# Patient Record
Sex: Male | Born: 1955
Health system: Southern US, Community
[De-identification: ages and names within clinical notes are randomized; demographics above are authoritative.]

## PROBLEM LIST (undated history)

## (undated) DIAGNOSIS — M069 Rheumatoid arthritis, unspecified: Secondary | ICD-10-CM

---

## 2000-10-09 ENCOUNTER — Encounter (HOSPITAL_COMMUNITY): Admission: RE | Admit: 2000-10-09 | Discharge: 2000-11-08 | Payer: Self-pay | Admitting: Rheumatology

## 2001-01-01 ENCOUNTER — Encounter (HOSPITAL_COMMUNITY): Admission: RE | Admit: 2001-01-01 | Discharge: 2001-01-31 | Payer: Self-pay | Admitting: Rheumatology

## 2001-06-18 ENCOUNTER — Encounter (HOSPITAL_COMMUNITY): Admission: RE | Admit: 2001-06-18 | Discharge: 2001-07-18 | Payer: Self-pay | Admitting: Oncology

## 2001-09-17 ENCOUNTER — Encounter (HOSPITAL_COMMUNITY): Admission: RE | Admit: 2001-09-17 | Discharge: 2001-10-17 | Payer: Self-pay | Admitting: Rheumatology

## 2001-12-31 ENCOUNTER — Encounter (HOSPITAL_COMMUNITY): Admission: RE | Admit: 2001-12-31 | Discharge: 2002-01-30 | Payer: Self-pay | Admitting: Rheumatology

## 2002-03-18 ENCOUNTER — Encounter: Payer: Self-pay | Admitting: Rheumatology

## 2002-03-18 ENCOUNTER — Encounter (HOSPITAL_COMMUNITY): Admission: RE | Admit: 2002-03-18 | Discharge: 2002-03-26 | Payer: Self-pay | Admitting: Rheumatology

## 2002-03-27 ENCOUNTER — Emergency Department (HOSPITAL_COMMUNITY): Admission: EM | Admit: 2002-03-27 | Discharge: 2002-03-27 | Payer: Self-pay | Admitting: Emergency Medicine

## 2002-07-08 ENCOUNTER — Encounter (HOSPITAL_COMMUNITY): Admission: RE | Admit: 2002-07-08 | Discharge: 2002-08-07 | Payer: Self-pay | Admitting: Rheumatology

## 2002-10-28 ENCOUNTER — Encounter (HOSPITAL_COMMUNITY): Admission: RE | Admit: 2002-10-28 | Discharge: 2002-11-27 | Payer: Self-pay | Admitting: Rheumatology

## 2003-02-10 ENCOUNTER — Encounter (HOSPITAL_COMMUNITY): Admission: RE | Admit: 2003-02-10 | Discharge: 2003-03-12 | Payer: Self-pay | Admitting: Rheumatology

## 2003-07-27 ENCOUNTER — Emergency Department (HOSPITAL_COMMUNITY): Admission: EM | Admit: 2003-07-27 | Discharge: 2003-07-28 | Payer: Self-pay | Admitting: *Deleted

## 2003-08-10 ENCOUNTER — Ambulatory Visit (HOSPITAL_COMMUNITY): Admission: RE | Admit: 2003-08-10 | Discharge: 2003-08-10 | Payer: Self-pay | Admitting: Family Medicine

## 2010-05-15 ENCOUNTER — Ambulatory Visit (HOSPITAL_COMMUNITY)
Admission: RE | Admit: 2010-05-15 | Discharge: 2010-05-15 | Payer: Self-pay | Source: Home / Self Care | Attending: Rheumatology | Admitting: Rheumatology

## 2010-09-14 NOTE — Consult Note (Signed)
[facility]  Patient:    Richard Manning, Richard Manning Visit Number: [id number] MRN: [id number]          Service Type: RHE Location: [facility] Attending Physician:  [name] Dictated by:   Nathaneil Canary, M.D.                            Consultation Report  CHIEF COMPLAINT: Rheumatoid arthritis.  HISTORY OF PRESENT ILLNESS:  I had last seen Mr. Stadler on January 01, 2001.  Unfortunately, his appointment had to be canceled because of snow and ice, both in December and January.  He continues to ache in his hands, being the most active joints.  He had a flare of right heel pain about three weeks ago which resolved in a few days.  His ankles and feet still ache moderately. He is stiff in the mornings for about 30 minutes.  He has had no change in his cough or shortness of breath.  There have been no URIs or fever.  Labs checked on January 02, 2000, showed a creatinine of 0.9, hemoglobin 14.9, WBC 9.6, platelets 306, albumin 4.1, AST 20.  CURRENT MEDICATIONS:  Prednisone 5 mg q. day, calcium 600 mg b.i.d., folic acid 1 mg q. day, MVI q. day, methotrexate 20 mg q. day.  PHYSICAL EXAMINATION:  VITAL SIGNS:  Weight 127 pounds, blood pressure 110/70, respirations 16.  GENERAL:  No distress.  LUNGS:  Quiet sounds but no rhonchi or wheeze.  HEART:  Regular.  LOWER EXTREMITIES:  No edema.  MUSCULOSKELETAL: He continues to have moderate synovitis to the PIPs.  The MCPs show mild fullness and have mild tenderness.  Wrists have mild swelling and tenderness.  The elbows, shoulders, and knees have good range of motion. The right knee has a pop anterior to the patella.  The ankles and feet have mild tenderness.  ASSESSMENT AND PLAN: 1. Rheumatoid arthritis.  He continues to show some activity.  I will have him increase the methotrexate to a total of 25 mg per week.  He will take 12.5 mg q. Saturday morning and 12.5 mg q. Sunday morning.  This will all be taken  in a 24-hour window.  He will continue on the folic acid and prednisone as above. We will check labs today. 2. He will return in three months. Dictated by:   Nathaneil Canary, M.D. Attending Physician:  [name] DD:  06/18/01 TD:  06/18/01 Job: [id number]

## 2010-09-14 NOTE — Consult Note (Signed)
Richard Manning, BELMONTES NO.:  [id number]   MEDICAL RECORD NO.:  [id number]                   PATIENT TYPE:   LOCATION:                                       FACILITY:   PHYSICIAN:  [name], M.D.            DATE OF BIRTH:   DATE OF CONSULTATION:  03/18/2002  DATE OF DISCHARGE:                                   CONSULTATION   CHIEF COMPLAINT:  Rheumatoid arthritis.   HISTORY OF PRESENT ILLNESS:  The patient returns reporting that his hands  have been quite stiff and constantly ache.  He is stiff in the mornings for  two to three hours.  He says his energy level is less.  There have been no  URIs or shortness of breath.  He does smoke, and there is some mild  windedness.  His weight is stable.  He complains that he aches in his hips  and points to the lateral hips.  This has bothered him with the weather  changes.   MEDICATIONS:  1. Methotrexate 25 mg each week.  2. Plaquenil 400 mg q.d.  3. Folic acid 1 mg q.d.  4. Calcium 600 mg q.d.  5. Ibuprofen 800 mg q.d.-b.i.d. p.r.n.  6. Tylenol PM h.s.   PHYSICAL EXAMINATION:  VITAL SIGNS:  Weight 135 pounds.  Blood pressure  110/70, respirations 16.  GENERAL:  In no distress.  SKIN:  Clear.  Slightly dark.  LUNGS:  Quiet sounds, but no rhonchi or wheeze.  HEART:  Regular rhythm.  No murmur.  NECK:  Negative JVD.  EXTREMITIES:  Lower extremities, no edema.  MUSCULOSKELETAL:  There continues to be moderate swelling to the PIPs and  MCPs with only mild tenderness.  The wrists have some mild swelling and  tenderness.  Elbows, shoulders good range of motion with mild stiffness.  Knees, ankles, and feet have a good range of motion and are nontender.   ASSESSMENT AND PLAN:  Rheumatoid arthritis.  He has been on Plaquenil now  for six months, and we are making an appointment for him to be evaluated by  Dr. Lita Mains.  He will continue with Plaquenil 400 mg q.d.  He will also  continue to take  methotrexate 25 mg each week.  We will check laboratories  today.  Laboratories on December 11, 2001, showed a WBC 11.9, HGB 15.6, PLT 279.  Creatinine 0.8.  AST 20,  albumin 4.3.  His hands were last x-rayed in January 2001, and we will x-ray  the hands again today.   He will return in four months, and we will also check laboratories in mid  March 2004.                                               Rutherford Guys.  Kellie Simmering, M.D.    WWT/MEDQ  D:  03/18/2002  T:  03/18/2002  Job:  [id number]   cc:   Patrica Duel, M.D.  [address], [address]  [place]  [place] [address]  Fax: [phone]

## 2010-09-14 NOTE — Consult Note (Signed)
NAME:  Richard Manning, Richard Manning NO.:  [id number]   MEDICAL RECORD NO.:  [id number]                   PATIENT TYPE:   LOCATION:                                       FACILITY:   PHYSICIAN:  [name], M.D.            DATE OF BIRTH:   DATE OF CONSULTATION:  02/17/2003  DATE OF DISCHARGE:                                   CONSULTATION   CHIEF COMPLAINT:  Rheumatoid arthritis.   Richard Manning returns reporting his hands have been achy but they are not  bad.  He has cut back on caffeine and feels that this has helped his  headaches.  He had been drinking iced tea throughout the day.  He feels he  is sleeping some better at this time.  He has been able to put on 10 pounds  since seeing him four months ago.  He has some mild shortness of breath but  no excessive cough.  There has been no fever, rashes, stomatitis, nausea, or  vomiting.   MEDICINES:  1. Methotrexate 25 mg weekly.  2. Plaquenil 400 mg daily.  3. Prednisone 5 mg daily.  4. Rare ibuprofen.  5. Calcium 600 mg with vitamin D b.i.d.  6. Multivitamin.  7. Folic acid 1 mg daily.   PHYSICAL EXAMINATION:  VITAL SIGNS:  Weight 138 pounds, blood pressure  118/72, respirations 16.  GENERAL:  He appears well.  LUNGS:  Have quiet sounds but clear.  HEART:  Regular with no murmur.  LOWER EXTREMITIES:  No edema.  NECK:  Negative JVD.  BACK:  Nontender.  MUSCULOSKELETAL:  He has mild chronic synovitis to the PIPs, MCPs, and  wrists.  The second bilateral MCPs are slightly full and all these areas  have slight tenderness.  The wrists are cool and have slight tenderness.  Elbows, shoulders good range of motion.  Knees, ankles, and feet have a good  range of motion and show no active arthritis.   ASSESSMENT AND PLAN:  1. Rheumatoid arthritis.  In general he is stable and he will continue the     methotrexate, folic acid, and Plaquenil as above.  I have written a note     for him to work with Dr.  Geanie Logan office to have labs in mid January to     include the albumin, creatinine, AST, and CBC.  Labs on February 10, 2003     showed a WBC 8.6, HGB 15.3, PLT 271, albumin 3.9, AST 23, creatinine 0.9.  2.     Smoking.  He is continuing to try and cut down and quit.  I have encouraged      him to quit and strategies to quit were again discussed.   He will return to my [place] office in mid February.      ___________________________________________  [name], M.D.   WWT/MEDQ  D:  02/17/2003  T:  02/17/2003  Job:  [id number]   cc:   Patrica Duel, M.D.  [address]  [place]  [place] [address]  Fax: [phone]

## 2010-09-14 NOTE — Consult Note (Signed)
[facility]  Patient:    JOB, HOLTSCLAW Visit Number: [id number] MRN: [id number]          Service Type: RHE Location: [facility] Attending Physician:  [name] Dictated by:   [name], M.D. Proc. Date: 09/17/01 Admit Date:  09/17/2001   CC:         Patrica Duel, M.D.   Consultation Report  CHIEF COMPLAINT:  Rheumatoid arthritis.  BRIEF HISTORY:  The patient called in late winter requesting a refill on the prednisone which I have not refilled because I would prefer that he is off of this medicine.  He is using ibuprofen at this time.  He reports that he is aching more in the hands, and wrist, and feet.  His weight is up by 11 pounds. He has quit smoking recently.  There has been no worsened cough or shortness of breath.  He has no fevers, URIs, nausea, or stomatitis.  He is stiff in the mornings for about 1 hour.  Labs checked on June 18, 2001, showed an albumin 4.4, AST 19, creatinine 0.9, WBC 13.2, hemoglobin 16.3, platelets 272.  MEDICATIONS: 1. Methotrexate 12.5 mg q. Friday and Saturday a.m. 2. Off prednisone. 3. Calcium 600 mg b.i.d. 4. Folic acid 1 mg q. day.  PHYSICAL EXAMINATION:  VITAL SIGNS:  Weight 138 pounds, blood pressure 110/70, respirations 16.  GENERAL:  No distress.  LUNGS:  Quiet sounds.  EXTREMITIES:  Lower extremities no edema.  HEART:  Regular.  MUSCULOSKELETAL:  The hands do show some swelling to the PIPs and MCPs.  There is actually more swelling to the wrist which is tender.  Elbows and shoulders have a good range of motion.  There are no elbow nodules.  Back:  Nontender. Knees:  The left knee flexes well and is nontender.  The right knee has a pop that appears to be close to the patella.  There seems to be no swelling to through his jeans.  Ankles were nonswollen.  The MTPs were tender.  ASSESSMENT AND PLAN:  Rheumatoid arthritis.  As I have explained to him that I would like to keep  him off of the prednisone for the long term.  He will continue with the methotrexate at 25 mg per week.  I have started him on Plaquenil 400 mg q. day.  I have discussed that there is a 1 in 3000 chance of incurring macular degeneration.  We will have his eyes checked about every 6 months to rule out Plaquenil toxicity.  We will check usual labs today.  FOLLOWUP:  He will return in 3 months. Dictated by:   [name], M.D. Attending Physician:  [name] DD:  09/17/01 TD:  09/19/01 Job: [id number]

## 2010-09-14 NOTE — Consult Note (Signed)
Richard Manning, Richard Manning                      ACCOUNT NO.:  [id number]   MEDICAL RECORD NO.:  [id number]                   PATIENT TYPE:   LOCATION:                                       FACILITY:   PHYSICIAN:  [name], M.D.            DATE OF BIRTH:  20-Mar-1956   DATE OF CONSULTATION:  07/08/2002  DATE OF DISCHARGE:                                   CONSULTATION   CHIEF COMPLAINT:  Rheumatoid arthritis.   HISTORY OF PRESENT ILLNESS:  The patient's x-rays of his hands from March 18, 2002 compared to January 2001 did show erosive changes to have  developed, particularly to the third PIP.  This almost has an overhanging  erosive-type change.  There has been a large cyst form at the head of the  second MCP.  There is also an erosive change at the left fourth PIP and  there are cystic changes at the third PIP.  The right triquetrum has some  developed cystic changes.  The patient brought in a prescription profile  from his pharmacist upon my request.  This profile begins on May 31, 2000 when he picked up methotrexate.  Reviewing through this, there had been  several gaps of picking up medicine of about six weeks.  In 2003, there were  two or possibly three times where there was about a two-month gap.   At this time, he says that his fingers, particularly the PIP's, were quite  swollen and tender.  He has had one URI over the winter.  There is no  chronic cough or shortness of breath.  He does smoke.  His weight is stable  but is down 2 pounds.   MEDICATIONS:  1. Methotrexate at approximately 25 mg weekly.  2. Plaquenil 400 mg daily.  3. Ibuprofen 800 mg b.i.d.  4. Folic acid 1 mg daily.  5. Calcium 600 mg daily.   PHYSICAL EXAMINATION:  VITAL SIGNS:  Weight 133 pounds, blood pressure  110/70, respirations 16.  GENERAL:  No distress.  LUNGS:  Quiet sounds but clear.  NECK:  Negative JVD.  HEART:  Regular.  No murmur.  EXTREMITIES:  Lower extremities:  No  edema.  BACK:  Nontender.  MUSCULOSKELETAL:  The hands do show the second and third bilateral PIP's  swollen.  The right second MCP is swollen.  The wrists are nonswollen,  nontender.  Elbows, shoulders good range of motion.  Knees and ankles have  no swelling.  They were nontender.  He had mild MTP tenderness.   ASSESSMENT AND PLAN:  Rheumatoid arthritis.  I still believe that this is  rheumatoid arthritis but the x-rays with the type of erosion that is shown  is somewhat consistent with gout.  I will check a uric acid.  At the present  time, he will continue on the methotrexate and I have encouraged him to be  more consistent taking 25 mg each  week.  He will also take Plaquenil 400 mg  daily.  He has not had his eyes checked in over half a year and we will help  get this set up.  Today, we are giving him an injection of 120 mg of Depo-  Medrol.  I have given him a prescription for prednisone 5 mg.  He will use  this for about three months and the medicine will run out.  I have advised  him to not take ibuprofen each day while taking the prednisone.  This will  reduce the risk of gastrointestinal bleeding.   We will check labs today and he will return in four months.                                               [name], M.D.    WWT/MEDQ  D:  07/08/2002  T:  07/08/2002  Job:  [id number]   cc:   Patrica Duel, M.D.  [address]  [place]  [place] [address]  Fax: [phone]

## 2010-09-14 NOTE — Consult Note (Signed)
Richard Manning, CABREJA NO.:  [id number]   MEDICAL RECORD NO.:  [id number]                   PATIENT TYPE:   LOCATION:                                       FACILITY:   PHYSICIAN:  [name], M.D.            DATE OF BIRTH:   DATE OF CONSULTATION:  12/31/2001  DATE OF DISCHARGE:                                   CONSULTATION   CHIEF COMPLAINT:  Rheumatoid arthritis.   HISTORY OF PRESENT ILLNESS:  The patient returns after using the Plaquenil  for three months and feels that he is somewhat better.  He has had flare  into his hands and wrists.  He has also had achiness and soreness to his  shoulders.  He is still able to lift his shoulders fairly well.  He says  this feels as it did when I started working with him early on.  His weight  is stable, although down about 4 pounds.  He has had no significant URI's,  fever, cough, nausea, stomatitis, or shortness of breath.  He does continue  to smoke.  He is quite stiff in the morning.  He has questions about  swelling to the wrist and whether this can be surgically removed.  This is  probably not a surgical type matter.   MEDICATIONS:  1. Plaquenil 400 mg q.d.  2. Methotrexate 12.5 mg in 24 hours on Saturday.  3. Folic acid 1 mg q.d.  4. Multivitamin q.d.  5. Calcium 600 mg b.i.d.  6. Ibuprofen 800 mg 1 q.d.-b.i.d.  7. Tylenol PM h.s.   PHYSICAL EXAMINATION:  VITAL SIGNS:  Weight 133 pounds.  Blood pressure  114/60, respirations 14.  LUNGS:  Quiet sounds but clear.  HEART:  Regular.  SKIN:  Overall clear, tan.  EXTREMITIES:  Lower extremities:  No edema.  MUSCULOSKELETAL:  The MCP's show little synovitis and they are nontender.  He has more chronic swelling to the wrist, particularly to the left ulnar  area with dorsal synovitis.  These areas are mildly tender.  The elbows  extend fully and there are no nodules.  Shoulders have stiffness but a good  range of motion.  Back nontender.  Hips  and knees good range of motion and  there is no pain.  The ankles had no swelling and were mildly tender.  The  feet had no tenderness.   ASSESSMENT AND PLAN:  Rheumatoid arthritis.  He continues to have the slow  progression of his arthritis, although I find that he is under reasonable  control at this time.  He did discuss that he has significant fatigue with  this, which is a common problem with rheumatoid arthritis.  The plan is to  continue the medicines as above.  Labs  checked on Sep 17, 2001 showed an albumin at 4.0, AST 28, WBC 8.1, HGB 14.7,  PLT 89, and creatinine 0.8.  We will repeat labs  today.   He will return in three months.                                               [name], M.D.    WWT/MEDQ  D:  12/31/2001  T:  01/01/2002  Job:  [id number]   cc:   Jonell Cluck, M.D.  [address], [address]  [place]  [place] [address]  Fax: [phone]

## 2010-09-14 NOTE — Consult Note (Signed)
NAME:  Richard Manning, Credit NO.:  [id number]   MEDICAL RECORD NO.:  [id number]                   PATIENT TYPE:   LOCATION:                                       FACILITY:   PHYSICIAN:  Richard Manning, M.D.            DATE OF BIRTH:   DATE OF CONSULTATION:  10/28/2002  DATE OF DISCHARGE:                                   CONSULTATION   CHIEF COMPLAINT:  Rheumatoid arthritis, headaches.   HISTORY OF PRESENT ILLNESS:  Richard Manning returns reporting that he has  been aching some in his left thumb and he has noticed some left elbow  nodules.  He has other nodules to the right hand on some of the fingers.  He  has some numbness around the left fourth PIP where there is a nodule.  He  has been having headaches more frequently over the last few weeks.  He says  he normally has about four headaches a year, and he has had one to two  headaches each week for a month.  He has lost 5 pounds, and he says he has a  good appetite and is eating three meals a day.  He has some slight cough  with his smoking, but no real shortness of breath.  He denies fevers,  rashes, stomatitis or back pain.   MEDICATIONS:  1. Plaquenil 400 mg daily.  2. Methotrexate 25 mg q.week.  3. Prednisone 5 mg daily.  4. Rare ibuprofen 800 mg.  5. Folic acid 1 mg daily.  6. Calcium 600 mg daily.   PHYSICAL EXAMINATION:  VITAL SIGNS:  Weight 128 pounds, blood pressure  100/70, respirations 16.  GENERAL APPEARANCE:  No distress.  SKIN:  Clear except for slight nodules at the fourth right PIP and on the  tendon surface of the back of the right hand.  There is a nodule at the left  olecranon area that is cool.  MUSCULOSKELETAL:  The hands and wrists have mild fullness to the PIP, MCP  and wrist, but they are cool and nontender.  Elbows extend fully.  Shoulders, knees, ankles, feet have a good range of motion and show no  active arthritis.   ASSESSMENT/PLAN:  1. Rheumatoid arthritis.   He is overall quite stable.  We will continue the     prednisone, methotrexate and Plaquenil as above.  He did have his eyes     checked by Dr. Lita Manning about two months ago, and he is fine to continue     Plaquenil.  Labs checked in March showed a WBC 9.0, HGB 15.4, PLT 283,     albumin 4.2, AST 22, creatinine 0.8.  We will check labs today and again     in three months.  2. Headaches.  He has found that occasional use of ibuprofen 800 mg helps     the headaches.  It is suggested that he can add Tylenol 1000/1500 mg  with     this once or twice several days per week.  He knows to not take a great     deal of ibuprofen while on the prednisone.  If this is not helping over a     few weeks, he will discuss this situation with Dr. Nobie Manning.   FOLLOWUP:  He will return in four months.                                               Richard Manning, M.D.    WWT/MEDQ  D:  10/28/2002  T:  10/28/2002  Job:  [id number]   cc:   Richard Manning, M.D.  [address], [address]  [place]  [place] [address]  Fax: [phone]

## 2011-05-29 DIAGNOSIS — M069 Rheumatoid arthritis, unspecified: Secondary | ICD-10-CM | POA: Diagnosis not present

## 2011-05-29 DIAGNOSIS — Z79899 Other long term (current) drug therapy: Secondary | ICD-10-CM | POA: Diagnosis not present

## 2011-07-18 DIAGNOSIS — Z79899 Other long term (current) drug therapy: Secondary | ICD-10-CM | POA: Diagnosis not present

## 2011-07-18 DIAGNOSIS — M069 Rheumatoid arthritis, unspecified: Secondary | ICD-10-CM | POA: Diagnosis not present

## 2011-07-18 DIAGNOSIS — J069 Acute upper respiratory infection, unspecified: Secondary | ICD-10-CM | POA: Diagnosis not present

## 2011-08-21 DIAGNOSIS — M069 Rheumatoid arthritis, unspecified: Secondary | ICD-10-CM | POA: Diagnosis not present

## 2011-08-21 DIAGNOSIS — J069 Acute upper respiratory infection, unspecified: Secondary | ICD-10-CM | POA: Diagnosis not present

## 2011-08-21 DIAGNOSIS — Z79899 Other long term (current) drug therapy: Secondary | ICD-10-CM | POA: Diagnosis not present

## 2011-11-27 DIAGNOSIS — J069 Acute upper respiratory infection, unspecified: Secondary | ICD-10-CM | POA: Diagnosis not present

## 2011-11-27 DIAGNOSIS — Z79899 Other long term (current) drug therapy: Secondary | ICD-10-CM | POA: Diagnosis not present

## 2011-11-27 DIAGNOSIS — M069 Rheumatoid arthritis, unspecified: Secondary | ICD-10-CM | POA: Diagnosis not present

## 2011-12-16 DIAGNOSIS — R634 Abnormal weight loss: Secondary | ICD-10-CM | POA: Diagnosis not present

## 2011-12-16 DIAGNOSIS — F17200 Nicotine dependence, unspecified, uncomplicated: Secondary | ICD-10-CM | POA: Diagnosis not present

## 2011-12-16 DIAGNOSIS — M069 Rheumatoid arthritis, unspecified: Secondary | ICD-10-CM | POA: Diagnosis not present

## 2011-12-16 DIAGNOSIS — Z79899 Other long term (current) drug therapy: Secondary | ICD-10-CM | POA: Diagnosis not present

## 2011-12-23 ENCOUNTER — Ambulatory Visit (HOSPITAL_COMMUNITY)
Admission: RE | Admit: 2011-12-23 | Discharge: 2011-12-23 | Disposition: A | Discharge status: Home / Self Care | Payer: Medicare Other | Source: Ambulatory Visit | Attending: Rheumatology | Admitting: Rheumatology

## 2011-12-23 ENCOUNTER — Other Ambulatory Visit (HOSPITAL_COMMUNITY): Payer: Self-pay | Admitting: Rheumatology

## 2011-12-23 DIAGNOSIS — J40 Bronchitis, not specified as acute or chronic: Secondary | ICD-10-CM | POA: Diagnosis not present

## 2011-12-23 DIAGNOSIS — R634 Abnormal weight loss: Secondary | ICD-10-CM | POA: Diagnosis not present

## 2011-12-23 DIAGNOSIS — F17200 Nicotine dependence, unspecified, uncomplicated: Secondary | ICD-10-CM | POA: Insufficient documentation

## 2011-12-23 DIAGNOSIS — R0602 Shortness of breath: Secondary | ICD-10-CM | POA: Diagnosis not present

## 2012-03-16 DIAGNOSIS — F17200 Nicotine dependence, unspecified, uncomplicated: Secondary | ICD-10-CM | POA: Diagnosis not present

## 2012-03-16 DIAGNOSIS — R634 Abnormal weight loss: Secondary | ICD-10-CM | POA: Diagnosis not present

## 2012-03-16 DIAGNOSIS — Z79899 Other long term (current) drug therapy: Secondary | ICD-10-CM | POA: Diagnosis not present

## 2012-03-16 DIAGNOSIS — M069 Rheumatoid arthritis, unspecified: Secondary | ICD-10-CM | POA: Diagnosis not present

## 2012-05-12 DIAGNOSIS — Z79899 Other long term (current) drug therapy: Secondary | ICD-10-CM | POA: Diagnosis not present

## 2012-05-12 DIAGNOSIS — F17200 Nicotine dependence, unspecified, uncomplicated: Secondary | ICD-10-CM | POA: Diagnosis not present

## 2012-05-12 DIAGNOSIS — M069 Rheumatoid arthritis, unspecified: Secondary | ICD-10-CM | POA: Diagnosis not present

## 2012-06-22 DIAGNOSIS — M069 Rheumatoid arthritis, unspecified: Secondary | ICD-10-CM | POA: Diagnosis not present

## 2012-06-22 DIAGNOSIS — Z79899 Other long term (current) drug therapy: Secondary | ICD-10-CM | POA: Diagnosis not present

## 2012-09-10 DIAGNOSIS — Z79899 Other long term (current) drug therapy: Secondary | ICD-10-CM | POA: Diagnosis not present

## 2012-09-10 DIAGNOSIS — M069 Rheumatoid arthritis, unspecified: Secondary | ICD-10-CM | POA: Diagnosis not present

## 2012-10-06 DIAGNOSIS — Z79899 Other long term (current) drug therapy: Secondary | ICD-10-CM | POA: Diagnosis not present

## 2012-10-06 DIAGNOSIS — M069 Rheumatoid arthritis, unspecified: Secondary | ICD-10-CM | POA: Diagnosis not present

## 2012-10-06 DIAGNOSIS — F17200 Nicotine dependence, unspecified, uncomplicated: Secondary | ICD-10-CM | POA: Diagnosis not present

## 2012-12-31 DIAGNOSIS — M069 Rheumatoid arthritis, unspecified: Secondary | ICD-10-CM | POA: Diagnosis not present

## 2012-12-31 DIAGNOSIS — Z79899 Other long term (current) drug therapy: Secondary | ICD-10-CM | POA: Diagnosis not present

## 2013-03-02 DIAGNOSIS — Z79899 Other long term (current) drug therapy: Secondary | ICD-10-CM | POA: Diagnosis not present

## 2013-03-02 DIAGNOSIS — M069 Rheumatoid arthritis, unspecified: Secondary | ICD-10-CM | POA: Diagnosis not present

## 2013-03-02 DIAGNOSIS — F17200 Nicotine dependence, unspecified, uncomplicated: Secondary | ICD-10-CM | POA: Diagnosis not present

## 2013-04-06 DIAGNOSIS — M069 Rheumatoid arthritis, unspecified: Secondary | ICD-10-CM | POA: Diagnosis not present

## 2013-04-06 DIAGNOSIS — Z79899 Other long term (current) drug therapy: Secondary | ICD-10-CM | POA: Diagnosis not present

## 2013-08-03 DIAGNOSIS — M069 Rheumatoid arthritis, unspecified: Secondary | ICD-10-CM | POA: Diagnosis not present

## 2013-08-03 DIAGNOSIS — Z79899 Other long term (current) drug therapy: Secondary | ICD-10-CM | POA: Diagnosis not present

## 2013-08-03 DIAGNOSIS — M2550 Pain in unspecified joint: Secondary | ICD-10-CM | POA: Diagnosis not present

## 2013-08-05 DIAGNOSIS — M069 Rheumatoid arthritis, unspecified: Secondary | ICD-10-CM | POA: Diagnosis not present

## 2013-08-05 DIAGNOSIS — Z79899 Other long term (current) drug therapy: Secondary | ICD-10-CM | POA: Diagnosis not present

## 2013-08-17 DIAGNOSIS — M069 Rheumatoid arthritis, unspecified: Secondary | ICD-10-CM | POA: Diagnosis not present

## 2013-10-02 ENCOUNTER — Encounter (HOSPITAL_COMMUNITY): Payer: Self-pay | Admitting: Emergency Medicine

## 2013-10-02 ENCOUNTER — Emergency Department (HOSPITAL_COMMUNITY)
Admission: EM | Admit: 2013-10-02 | Discharge: 2013-10-02 | Disposition: A | Discharge status: Home / Self Care | Payer: Medicare Other | Attending: Emergency Medicine | Admitting: Emergency Medicine

## 2013-10-02 DIAGNOSIS — Z8739 Personal history of other diseases of the musculoskeletal system and connective tissue: Secondary | ICD-10-CM | POA: Insufficient documentation

## 2013-10-02 DIAGNOSIS — L509 Urticaria, unspecified: Secondary | ICD-10-CM | POA: Diagnosis not present

## 2013-10-02 DIAGNOSIS — F17200 Nicotine dependence, unspecified, uncomplicated: Secondary | ICD-10-CM | POA: Diagnosis not present

## 2013-10-02 HISTORY — DX: Rheumatoid arthritis, unspecified: M06.9

## 2013-10-02 MED ORDER — LORATADINE 10 MG PO TABS
ORAL_TABLET | ORAL | Status: AC
  Filled 2013-10-02: qty 1

## 2013-10-02 MED ORDER — LORATADINE 10 MG PO TABS
10.0000 mg | ORAL_TABLET | Freq: Every day | ORAL | Status: DC
Start: 2013-10-02 — End: 2013-10-02
  Administered 2013-10-02: 10 mg via ORAL

## 2013-10-02 MED ORDER — FAMOTIDINE 20 MG PO TABS
20.0000 mg | ORAL_TABLET | Freq: Once | ORAL | Status: AC
  Administered 2013-10-02: 20 mg via ORAL
  Filled 2013-10-02: qty 1

## 2013-10-02 NOTE — ED Notes (Addendum)
Pt believes he is having an allergic reaction to parmesan cheese.  Pt reports rash on hips and back.  Pt denies SOB or swelling.  Reports having parmesan cheese yesterday and woke up this morning with rash.  Reports rash is itchy and painful.

## 2013-10-02 NOTE — ED Notes (Addendum)
Pt thinks the Parmesan cheese from last night caused hives, woke up with hives to bottom and bilateral hips and waist line that itch

## 2013-10-02 NOTE — Discharge Instructions (Signed)
Take the Zyrtec once a day, and Pepcid 20 mg twice a day for 5-7 days, to treat the rash.    Hives Hives are itchy, red, swollen areas of the skin. They can vary in size and location on your body. Hives can come and go for hours or several days (acute hives) or for several weeks (chronic hives). Hives do not spread from person to person (noncontagious). They may get worse with scratching, exercise, and emotional stress. CAUSES   Allergic reaction to food, additives, or drugs.  Infections, including the common cold.  Illness, such as vasculitis, lupus, or thyroid disease.  Exposure to sunlight, heat, or cold.  Exercise.  Stress.  Contact with chemicals. SYMPTOMS   Red or white swollen patches on the skin. The patches may change size, shape, and location quickly and repeatedly.  Itching.  Swelling of the hands, feet, and face. This may occur if hives develop deeper in the skin. DIAGNOSIS  Your caregiver can usually tell what is wrong by performing a physical exam. Skin or blood tests may also be done to determine the cause of your hives. In some cases, the cause cannot be determined. TREATMENT  Mild cases usually get better with medicines such as antihistamines. Severe cases may require an emergency epinephrine injection. If the cause of your hives is known, treatment includes avoiding that trigger.  HOME CARE INSTRUCTIONS   Avoid causes that trigger your hives.  Take antihistamines as directed by your caregiver to reduce the severity of your hives. Non-sedating or low-sedating antihistamines are usually recommended. Do not drive while taking an antihistamine.  Take any other medicines prescribed for itching as directed by your caregiver.  Wear loose-fitting clothing.  Keep all follow-up appointments as directed by your caregiver. SEEK MEDICAL CARE IF:   You have persistent or severe itching that is not relieved with medicine.  You have painful or swollen joints. SEEK  IMMEDIATE MEDICAL CARE IF:   You have a fever.  Your tongue or lips are swollen.  You have trouble breathing or swallowing.  You feel tightness in the throat or chest.  You have abdominal pain. These problems may be the first sign of a life-threatening allergic reaction. Call your local emergency services (911 in [place] MAKE SURE YOU:   Understand these instructions.  Will watch your condition.  Will get help right away if you are not doing well or get worse. Document Released: 04/15/2005 Document Revised: 10/15/2011 Document Reviewed: 07/09/2011 [facility] Patient Information 2014 [place], [place].

## 2013-10-02 NOTE — ED Provider Notes (Signed)
CSN: [id number]     Arrival date & time 10/02/13  1850 History   First MD Initiated Contact with Patient 10/02/13 1942 This chart was scribed for Flint Melter, MD by Valera Castle, ED Scribe. This patient was seen in room APA03/APA03 and the patient's care was started at 7:45 PM.     Chief Complaint  Patient presents with  . Rash   (Consider location/radiation/quality/duration/timing/severity/associated sxs/prior Treatment) The history is provided by the patient. No language interpreter was used.   HPI Comments: Richard Manning is a 58 y.o. male who presents to the Emergency Department complaining of an itching generalized, hive-like rash over his buttocks, upper legs, and lower abdomen, onset this morning. He thinks he may be allergic to Parmesean cheese, stating that is the only thing unusual he has eaten recently. He states it has been a while since he has eaten that type of cheese. He denies h/o rash then. He denies fever, lip swelling, throat swelling, trouble swallowing, trouble breathing, and any other associated symptoms.   PCP - Cassell Smiles., MD  Past Medical History  Diagnosis Date  . RA (rheumatoid arthritis)    History reviewed. No pertinent past surgical history. History reviewed. No pertinent family history. History  Substance Use Topics  . Smoking status: Current Every Day Smoker -- 0.50 packs/day  . Smokeless tobacco: Not on file  . Alcohol Use: No    Review of Systems  Constitutional: Negative for fever.  HENT: Negative for facial swelling and trouble swallowing.   Respiratory: Negative for shortness of breath.   Skin: Positive for rash (buttocks, abdomen).  All other systems reviewed and are negative.  Allergies  Benadryl and Peanuts  Home Medications   Prior to Admission medications   Not on File   BP 124/84  Pulse 64  Temp(Src) 98.2 F (36.8 C) (Oral)  Resp 20  Ht 5\' 8"  (1.727 m)  Wt 135 lb (61.236 kg)  BMI 20.53 kg/m2  SpO2  99% Physical Exam  Nursing note and vitals reviewed. Constitutional: He is oriented to person, place, and time. He appears well-developed and well-nourished.  HENT:  Head: Normocephalic and atraumatic.  Right Ear: External ear normal.  Left Ear: External ear normal.  Eyes: Conjunctivae and EOM are normal. Pupils are equal, round, and reactive to light.  Neck: Normal range of motion and phonation normal. Neck supple.  Cardiovascular: Normal rate, regular rhythm, normal heart sounds and intact distal pulses.   Pulmonary/Chest: Effort normal and breath sounds normal. He exhibits no bony tenderness.  Abdominal: Soft. There is no tenderness.  Musculoskeletal: Normal range of motion.  Neurological: He is alert and oriented to person, place, and time. No cranial nerve deficit or sensory deficit. He exhibits normal muscle tone. Coordination normal.  Skin: Skin is warm, dry and intact. Rash noted.  Urticaria around the lower abdomen, bilateral buttocks, and suprapubic area.  Psychiatric: He has a normal mood and affect. His behavior is normal. Judgment and thought content normal.    ED Course  Procedures (including critical care time)  DIAGNOSTIC STUDIES: Oxygen Saturation is 99% on room air, normal by my interpretation.    COORDINATION OF CARE: 7:48 PM-Discussed treatment plan with pt at bedside and pt agreed to plan.   Medications  famotidine (PEPCID) tablet 20 mg (20 mg Oral Given 10/02/13 1954)   Patient Vitals for the past 24 hrs:  BP Temp Temp src Pulse Resp SpO2 Height Weight  10/02/13 1858 124/84 mmHg 98.2 F (36.8 C)  Oral 64 20 99 % 5\' 8"  (1.727 m) 135 lb (61.236 kg)     EKG Interpretation None      MDM   Final diagnoses:  Urticaria    Urticaria, cause unclear. No evidence for serious internal allergic reaction, including angioedema.   Nursing Notes Reviewed/ Care Coordinated Applicable Imaging Reviewed Interpretation of Laboratory Data incorporated into ED  treatment  The patient appears reasonably screened and/or stabilized for discharge and I doubt any other medical condition or other [facility] requiring further screening, evaluation, or treatment in the ED at this time prior to discharge.  Plan: Home Medications- Zyrtec and Pepcid; Home Treatments- rest; return here if the recommended treatment, does not improve the symptoms; Recommended follow up- PCP prn  I personally performed the services described in this documentation, which was scribed in my presence. The recorded information has been reviewed and is accurate.      [facility], MD 10/02/13 [phone]

## 2013-10-02 NOTE — ED Notes (Signed)
MD at bedside. 

## 2013-11-25 DIAGNOSIS — M069 Rheumatoid arthritis, unspecified: Secondary | ICD-10-CM | POA: Diagnosis not present

## 2013-11-25 DIAGNOSIS — Z79899 Other long term (current) drug therapy: Secondary | ICD-10-CM | POA: Diagnosis not present

## 2013-12-30 DIAGNOSIS — Z79899 Other long term (current) drug therapy: Secondary | ICD-10-CM | POA: Diagnosis not present

## 2013-12-30 DIAGNOSIS — M069 Rheumatoid arthritis, unspecified: Secondary | ICD-10-CM | POA: Diagnosis not present

## 2013-12-30 DIAGNOSIS — M25519 Pain in unspecified shoulder: Secondary | ICD-10-CM | POA: Diagnosis not present

## 2014-01-28 IMAGING — CR DG CHEST 2V
2 series · 2 of 2 positions shown · non-contrast
Comparison: None

CLINICAL DATA: Weight loss, smoker, shortness of breath

CHEST - 2 VIEW

[view not recorded (1 of 2)]
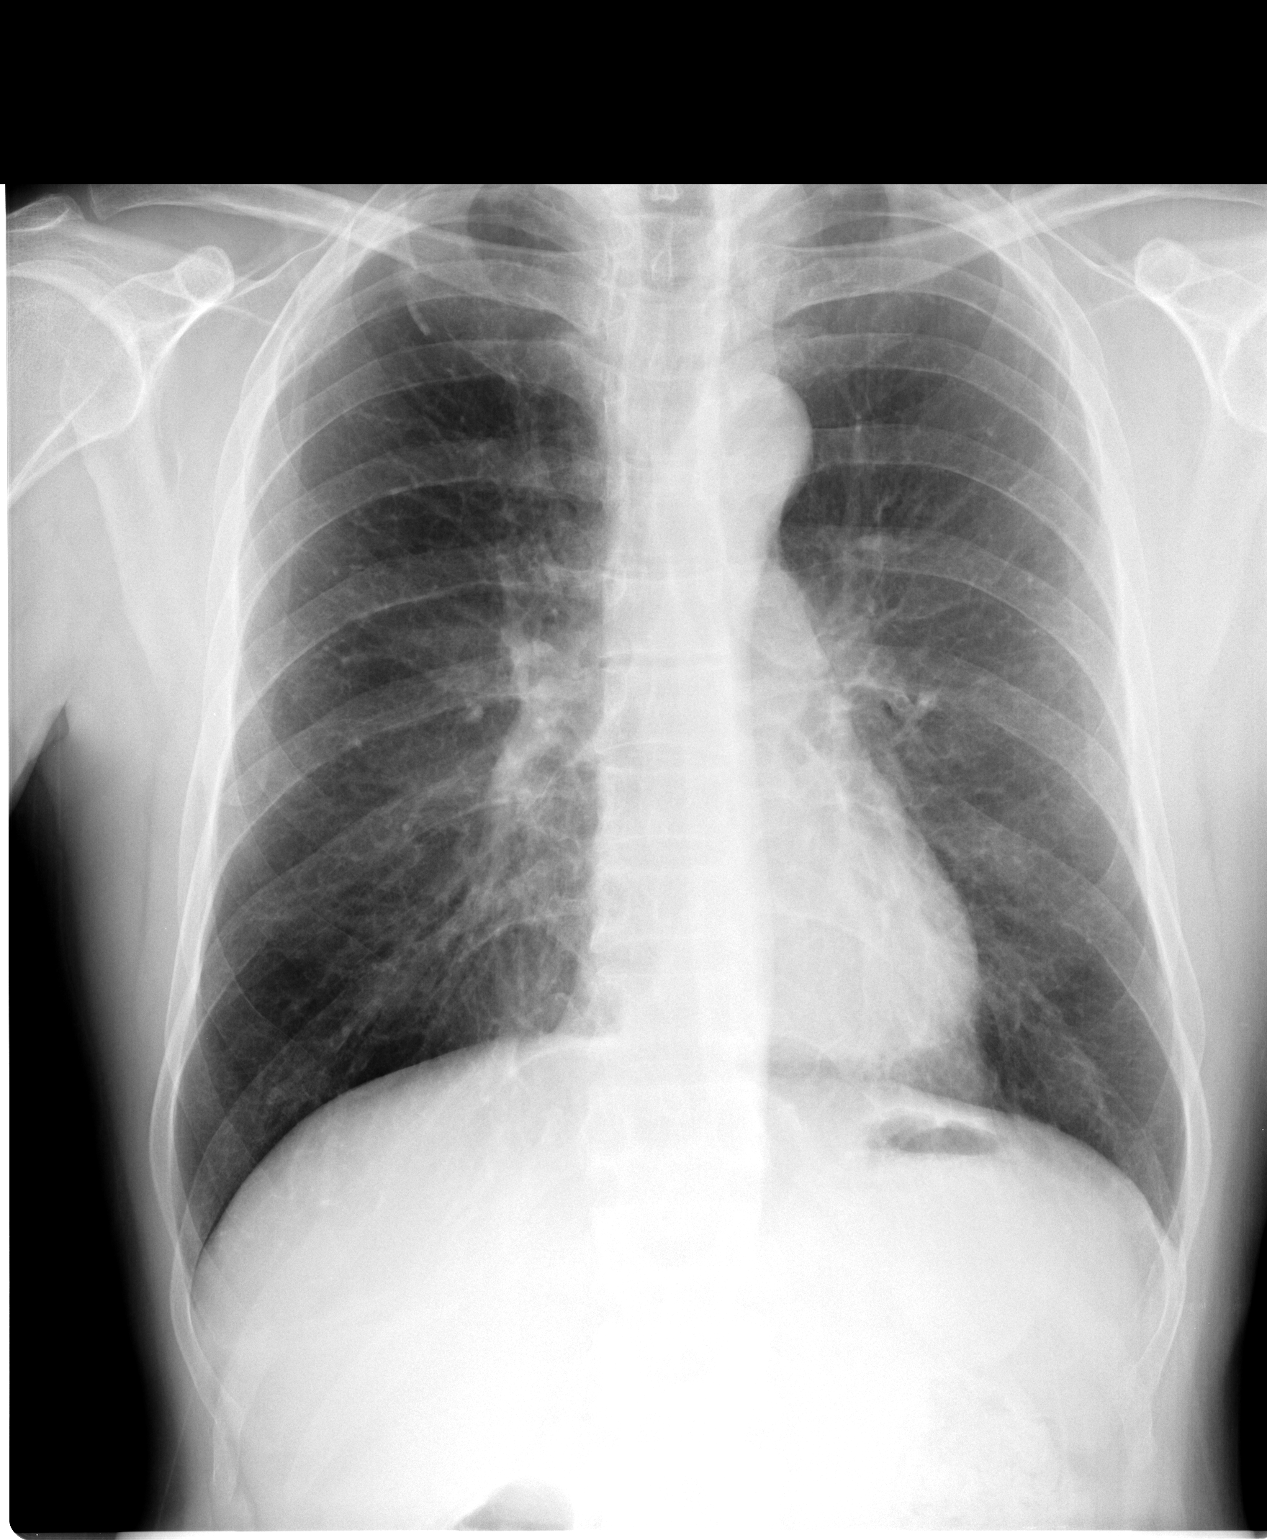

[view not recorded (2 of 2)]
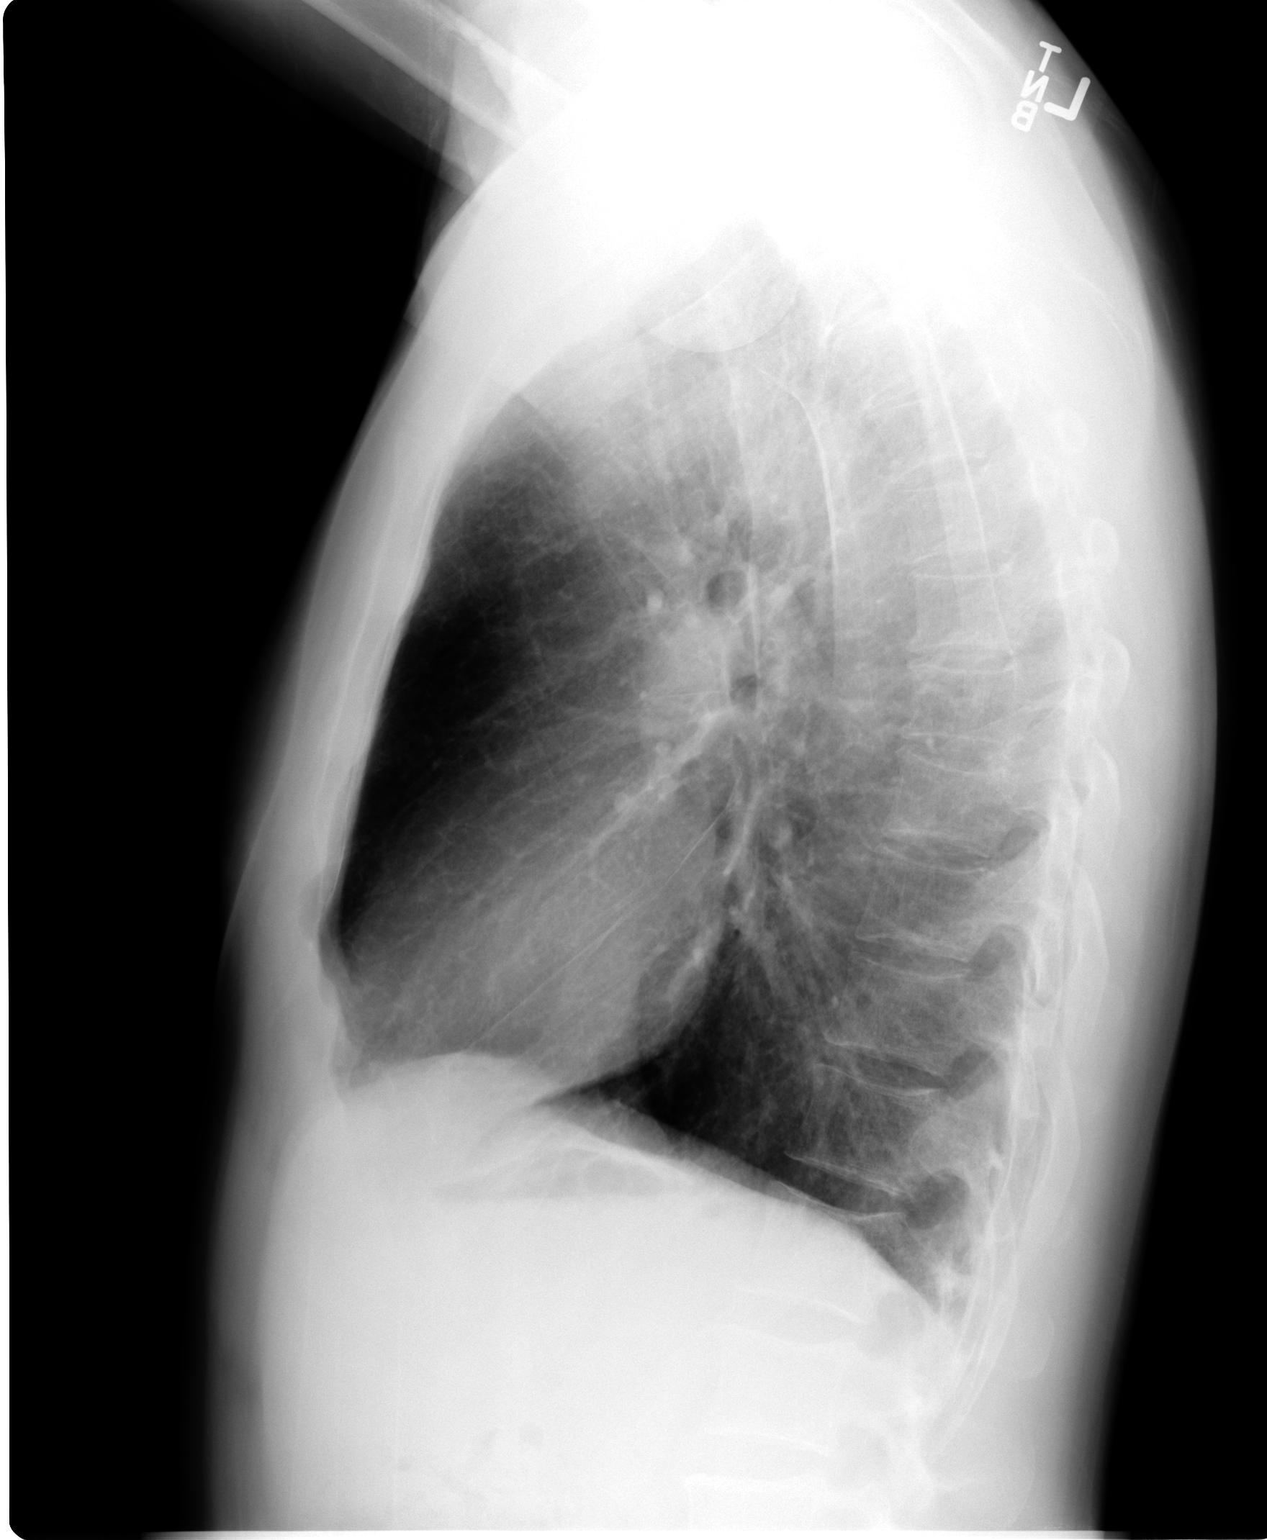

[2 of 2 positions shown; findings below may reference images not displayed]

FINDINGS: Normal heart size, mediastinal contours, and pulmonary vascularity.
Lungs clear.
No pleural effusion or pneumothorax.
Minimal peribronchial thickening.
Bones unremarkable.
IMPRESSION: Minimal bronchitic changes.

## 2014-02-28 DIAGNOSIS — M069 Rheumatoid arthritis, unspecified: Secondary | ICD-10-CM | POA: Diagnosis not present

## 2014-02-28 DIAGNOSIS — Z79899 Other long term (current) drug therapy: Secondary | ICD-10-CM | POA: Diagnosis not present

## 2014-03-29 DIAGNOSIS — J069 Acute upper respiratory infection, unspecified: Secondary | ICD-10-CM | POA: Diagnosis not present

## 2014-03-29 DIAGNOSIS — M069 Rheumatoid arthritis, unspecified: Secondary | ICD-10-CM | POA: Diagnosis not present

## 2014-03-29 DIAGNOSIS — Z7952 Long term (current) use of systemic steroids: Secondary | ICD-10-CM | POA: Diagnosis not present

## 2014-03-29 DIAGNOSIS — M545 Low back pain: Secondary | ICD-10-CM | POA: Diagnosis not present

## 2014-06-01 DIAGNOSIS — M069 Rheumatoid arthritis, unspecified: Secondary | ICD-10-CM | POA: Diagnosis not present

## 2014-06-01 DIAGNOSIS — Z7952 Long term (current) use of systemic steroids: Secondary | ICD-10-CM | POA: Diagnosis not present

## 2014-08-01 DIAGNOSIS — Z79899 Other long term (current) drug therapy: Secondary | ICD-10-CM | POA: Diagnosis not present

## 2014-08-01 DIAGNOSIS — M25441 Effusion, right hand: Secondary | ICD-10-CM | POA: Diagnosis not present

## 2014-08-01 DIAGNOSIS — M25442 Effusion, left hand: Secondary | ICD-10-CM | POA: Diagnosis not present

## 2014-08-01 DIAGNOSIS — M069 Rheumatoid arthritis, unspecified: Secondary | ICD-10-CM | POA: Diagnosis not present

## 2014-09-16 DIAGNOSIS — Z79899 Other long term (current) drug therapy: Secondary | ICD-10-CM | POA: Diagnosis not present

## 2014-09-16 DIAGNOSIS — M059 Rheumatoid arthritis with rheumatoid factor, unspecified: Secondary | ICD-10-CM | POA: Diagnosis not present

## 2014-12-06 DIAGNOSIS — R634 Abnormal weight loss: Secondary | ICD-10-CM | POA: Diagnosis not present

## 2014-12-06 DIAGNOSIS — M0570 Rheumatoid arthritis with rheumatoid factor of unspecified site without organ or systems involvement: Secondary | ICD-10-CM | POA: Diagnosis not present

## 2014-12-06 DIAGNOSIS — R0602 Shortness of breath: Secondary | ICD-10-CM | POA: Diagnosis not present

## 2014-12-06 DIAGNOSIS — Z79899 Other long term (current) drug therapy: Secondary | ICD-10-CM | POA: Diagnosis not present

## 2014-12-07 ENCOUNTER — Ambulatory Visit (HOSPITAL_COMMUNITY)
Admission: RE | Admit: 2014-12-07 | Discharge: 2014-12-07 | Disposition: A | Discharge status: Home / Self Care | Payer: Medicare Other | Source: Ambulatory Visit | Attending: Rheumatology | Admitting: Rheumatology

## 2014-12-07 ENCOUNTER — Other Ambulatory Visit (HOSPITAL_COMMUNITY): Payer: Self-pay | Admitting: Rheumatology

## 2014-12-07 DIAGNOSIS — R634 Abnormal weight loss: Secondary | ICD-10-CM | POA: Insufficient documentation

## 2014-12-07 DIAGNOSIS — F17200 Nicotine dependence, unspecified, uncomplicated: Secondary | ICD-10-CM

## 2014-12-07 DIAGNOSIS — Z720 Tobacco use: Secondary | ICD-10-CM | POA: Insufficient documentation

## 2014-12-07 DIAGNOSIS — R062 Wheezing: Secondary | ICD-10-CM

## 2014-12-07 DIAGNOSIS — M0570 Rheumatoid arthritis with rheumatoid factor of unspecified site without organ or systems involvement: Secondary | ICD-10-CM | POA: Diagnosis not present

## 2014-12-07 DIAGNOSIS — R0602 Shortness of breath: Secondary | ICD-10-CM | POA: Diagnosis not present

## 2014-12-07 DIAGNOSIS — Z79899 Other long term (current) drug therapy: Secondary | ICD-10-CM | POA: Diagnosis not present

## 2015-03-14 DIAGNOSIS — M0570 Rheumatoid arthritis with rheumatoid factor of unspecified site without organ or systems involvement: Secondary | ICD-10-CM | POA: Diagnosis not present

## 2015-03-14 DIAGNOSIS — Z79899 Other long term (current) drug therapy: Secondary | ICD-10-CM | POA: Diagnosis not present

## 2015-05-10 DIAGNOSIS — M79641 Pain in right hand: Secondary | ICD-10-CM | POA: Diagnosis not present

## 2015-05-10 DIAGNOSIS — M79642 Pain in left hand: Secondary | ICD-10-CM | POA: Diagnosis not present

## 2015-05-10 DIAGNOSIS — M0570 Rheumatoid arthritis with rheumatoid factor of unspecified site without organ or systems involvement: Secondary | ICD-10-CM | POA: Diagnosis not present

## 2015-05-10 DIAGNOSIS — Z79899 Other long term (current) drug therapy: Secondary | ICD-10-CM | POA: Diagnosis not present

## 2015-05-10 MED FILL — predniSONE 10 MG TABS: 10 | 30 days supply | Qty: 60 | Fill #0

## 2015-05-10 MED FILL — METHOTREXATE 2.5 MG TABLET: 2.5 | 30 days supply | Qty: 40 | Fill #0

## 2015-06-08 MED FILL — FOLIC ACID 1 MG TABLET: 1 | 30 days supply | Qty: 30 | Fill #6

## 2015-06-08 MED FILL — METHOTREXATE 2.5 MG TABLET: 2.5 | 30 days supply | Qty: 40 | Fill #1

## 2015-06-29 DIAGNOSIS — M0570 Rheumatoid arthritis with rheumatoid factor of unspecified site without organ or systems involvement: Secondary | ICD-10-CM | POA: Diagnosis not present

## 2015-06-29 DIAGNOSIS — Z79899 Other long term (current) drug therapy: Secondary | ICD-10-CM | POA: Diagnosis not present

## 2015-07-06 MED FILL — METHOTREXATE 2.5 MG TABLET: 2.5 | 30 days supply | Qty: 40 | Fill #2

## 2015-07-06 MED FILL — FOLIC ACID 1 MG TABLET: 1 | 30 days supply | Qty: 30 | Fill #7

## 2015-08-03 MED FILL — METHOTREXATE 2.5 MG TABLET: 2.5 | 30 days supply | Qty: 40 | Fill #3

## 2015-08-03 MED FILL — FOLIC ACID 1 MG TABLET: 1 | 30 days supply | Qty: 30 | Fill #8

## 2015-08-30 MED FILL — METHOTREXATE 2.5 MG TABLET: 2.5 | 30 days supply | Qty: 40 | Fill #4

## 2015-08-30 MED FILL — FOLIC ACID 1 MG TABLET: 1 | 30 days supply | Qty: 30 | Fill #9

## 2015-10-02 MED FILL — METHOTREXATE 2.5 MG TABLET: 2.5 | 28 days supply | Qty: 40 | Fill #0

## 2015-10-02 MED FILL — FOLIC ACID 1 MG TABLET: 1 | 90 days supply | Qty: 90 | Fill #0

## 2015-10-10 DIAGNOSIS — M25462 Effusion, left knee: Secondary | ICD-10-CM | POA: Diagnosis not present

## 2015-10-10 DIAGNOSIS — M0570 Rheumatoid arthritis with rheumatoid factor of unspecified site without organ or systems involvement: Secondary | ICD-10-CM | POA: Diagnosis not present

## 2015-10-10 DIAGNOSIS — Z79899 Other long term (current) drug therapy: Secondary | ICD-10-CM | POA: Diagnosis not present

## 2015-10-10 DIAGNOSIS — M25441 Effusion, right hand: Secondary | ICD-10-CM | POA: Diagnosis not present

## 2015-10-10 DIAGNOSIS — M25442 Effusion, left hand: Secondary | ICD-10-CM | POA: Diagnosis not present

## 2015-10-10 DIAGNOSIS — M25461 Effusion, right knee: Secondary | ICD-10-CM | POA: Diagnosis not present

## 2015-10-11 DIAGNOSIS — M25562 Pain in left knee: Secondary | ICD-10-CM | POA: Diagnosis not present

## 2015-10-11 DIAGNOSIS — M25441 Effusion, right hand: Secondary | ICD-10-CM | POA: Diagnosis not present

## 2015-10-11 DIAGNOSIS — M25561 Pain in right knee: Secondary | ICD-10-CM | POA: Diagnosis not present

## 2015-10-11 DIAGNOSIS — M25442 Effusion, left hand: Secondary | ICD-10-CM | POA: Diagnosis not present

## 2015-10-11 DIAGNOSIS — Z79899 Other long term (current) drug therapy: Secondary | ICD-10-CM | POA: Diagnosis not present

## 2015-10-11 DIAGNOSIS — M0570 Rheumatoid arthritis with rheumatoid factor of unspecified site without organ or systems involvement: Secondary | ICD-10-CM | POA: Diagnosis not present

## 2015-11-01 MED FILL — METHOTREXATE 2.5 MG TABLET: 2.5 | 28 days supply | Qty: 40 | Fill #0

## 2015-11-29 MED FILL — METHOTREXATE 2.5 MG TABLET: 2.5 | 28 days supply | Qty: 40 | Fill #1

## 2015-12-28 MED FILL — FOLIC ACID 1 MG TABLET: 1 | 90 days supply | Qty: 90 | Fill #1

## 2015-12-28 MED FILL — METHOTREXATE 2.5 MG TABLET: 2.5 | 28 days supply | Qty: 40 | Fill #2

## 2016-01-09 DIAGNOSIS — M25561 Pain in right knee: Secondary | ICD-10-CM | POA: Diagnosis not present

## 2016-01-09 DIAGNOSIS — M0570 Rheumatoid arthritis with rheumatoid factor of unspecified site without organ or systems involvement: Secondary | ICD-10-CM | POA: Diagnosis not present

## 2016-01-09 DIAGNOSIS — M25442 Effusion, left hand: Secondary | ICD-10-CM | POA: Diagnosis not present

## 2016-01-09 DIAGNOSIS — Z79899 Other long term (current) drug therapy: Secondary | ICD-10-CM | POA: Diagnosis not present

## 2016-01-09 DIAGNOSIS — M25562 Pain in left knee: Secondary | ICD-10-CM | POA: Diagnosis not present

## 2016-01-09 DIAGNOSIS — M25441 Effusion, right hand: Secondary | ICD-10-CM | POA: Diagnosis not present

## 2016-01-24 MED FILL — METHOTREXATE 2.5 MG TABLET: 2.5 | 28 days supply | Qty: 40 | Fill #3

## 2016-02-19 MED FILL — METHOTREXATE 2.5 MG TABLET: 2.5 | 28 days supply | Qty: 40 | Fill #4

## 2016-03-11 DIAGNOSIS — F17200 Nicotine dependence, unspecified, uncomplicated: Secondary | ICD-10-CM | POA: Diagnosis not present

## 2016-03-11 DIAGNOSIS — M25441 Effusion, right hand: Secondary | ICD-10-CM | POA: Diagnosis not present

## 2016-03-11 DIAGNOSIS — M0570 Rheumatoid arthritis with rheumatoid factor of unspecified site without organ or systems involvement: Secondary | ICD-10-CM | POA: Diagnosis not present

## 2016-03-11 DIAGNOSIS — M25442 Effusion, left hand: Secondary | ICD-10-CM | POA: Diagnosis not present

## 2016-03-20 MED FILL — METHOTREXATE 2.5 MG TABLET: 2.5 | 28 days supply | Qty: 40 | Fill #0

## 2016-03-20 MED FILL — FOLIC ACID 1 MG TABLET: 1 | 90 days supply | Qty: 90 | Fill #2

## 2016-04-08 DIAGNOSIS — M25441 Effusion, right hand: Secondary | ICD-10-CM | POA: Diagnosis not present

## 2016-04-08 DIAGNOSIS — F17200 Nicotine dependence, unspecified, uncomplicated: Secondary | ICD-10-CM | POA: Diagnosis not present

## 2016-04-08 DIAGNOSIS — M0570 Rheumatoid arthritis with rheumatoid factor of unspecified site without organ or systems involvement: Secondary | ICD-10-CM | POA: Diagnosis not present

## 2016-04-08 DIAGNOSIS — M25442 Effusion, left hand: Secondary | ICD-10-CM | POA: Diagnosis not present

## 2016-04-16 MED FILL — METHOTREXATE 2.5 MG TABLET: 2.5 | 28 days supply | Qty: 40 | Fill #1

## 2016-05-14 MED FILL — METHOTREXATE 2.5 MG TABLET: 2.5 | 28 days supply | Qty: 40 | Fill #2

## 2016-06-12 MED FILL — METHOTREXATE 2.5 MG TABLET: 2.5 | 28 days supply | Qty: 40 | Fill #3

## 2016-06-12 MED FILL — FOLIC ACID 1 MG TABLET: 1 | 90 days supply | Qty: 90 | Fill #3

## 2016-07-08 DIAGNOSIS — M0570 Rheumatoid arthritis with rheumatoid factor of unspecified site without organ or systems involvement: Secondary | ICD-10-CM | POA: Diagnosis not present

## 2016-07-08 DIAGNOSIS — M25441 Effusion, right hand: Secondary | ICD-10-CM | POA: Diagnosis not present

## 2016-07-08 DIAGNOSIS — M25442 Effusion, left hand: Secondary | ICD-10-CM | POA: Diagnosis not present

## 2016-07-08 DIAGNOSIS — F17200 Nicotine dependence, unspecified, uncomplicated: Secondary | ICD-10-CM | POA: Diagnosis not present

## 2016-07-10 MED FILL — METHOTREXATE 2.5 MG TABLET: 2.5 | 28 days supply | Qty: 40 | Fill #4

## 2016-08-12 DIAGNOSIS — Z79899 Other long term (current) drug therapy: Secondary | ICD-10-CM | POA: Diagnosis not present

## 2016-08-12 DIAGNOSIS — M79642 Pain in left hand: Secondary | ICD-10-CM | POA: Diagnosis not present

## 2016-08-12 DIAGNOSIS — M25511 Pain in right shoulder: Secondary | ICD-10-CM | POA: Diagnosis not present

## 2016-08-12 DIAGNOSIS — M79641 Pain in right hand: Secondary | ICD-10-CM | POA: Diagnosis not present

## 2016-08-12 DIAGNOSIS — M0570 Rheumatoid arthritis with rheumatoid factor of unspecified site without organ or systems involvement: Secondary | ICD-10-CM | POA: Diagnosis not present

## 2016-08-12 MED FILL — METHOTREXATE 2.5 MG TABLET: 2.5 | 28 days supply | Qty: 40 | Fill #0

## 2016-09-03 MED FILL — FOLIC ACID 1 MG TABLET: 1 | 90 days supply | Qty: 90 | Fill #4

## 2016-09-03 MED FILL — METHOTREXATE 2.5 MG TABLET: 2.5 | 28 days supply | Qty: 40 | Fill #1

## 2016-10-04 MED FILL — METHOTREXATE 2.5 MG TABLET: 2.5 | 28 days supply | Qty: 40 | Fill #2

## 2016-12-17 DIAGNOSIS — M069 Rheumatoid arthritis, unspecified: Secondary | ICD-10-CM | POA: Diagnosis not present

## 2017-01-12 IMAGING — DX DG CHEST 2V
2 series · 2 of 2 positions shown · non-contrast
Comparison: 12/23/2011

CLINICAL DATA: Shortness of breath, wheezing, weight loss

EXAM:
CHEST  2 VIEW

[chest pa]
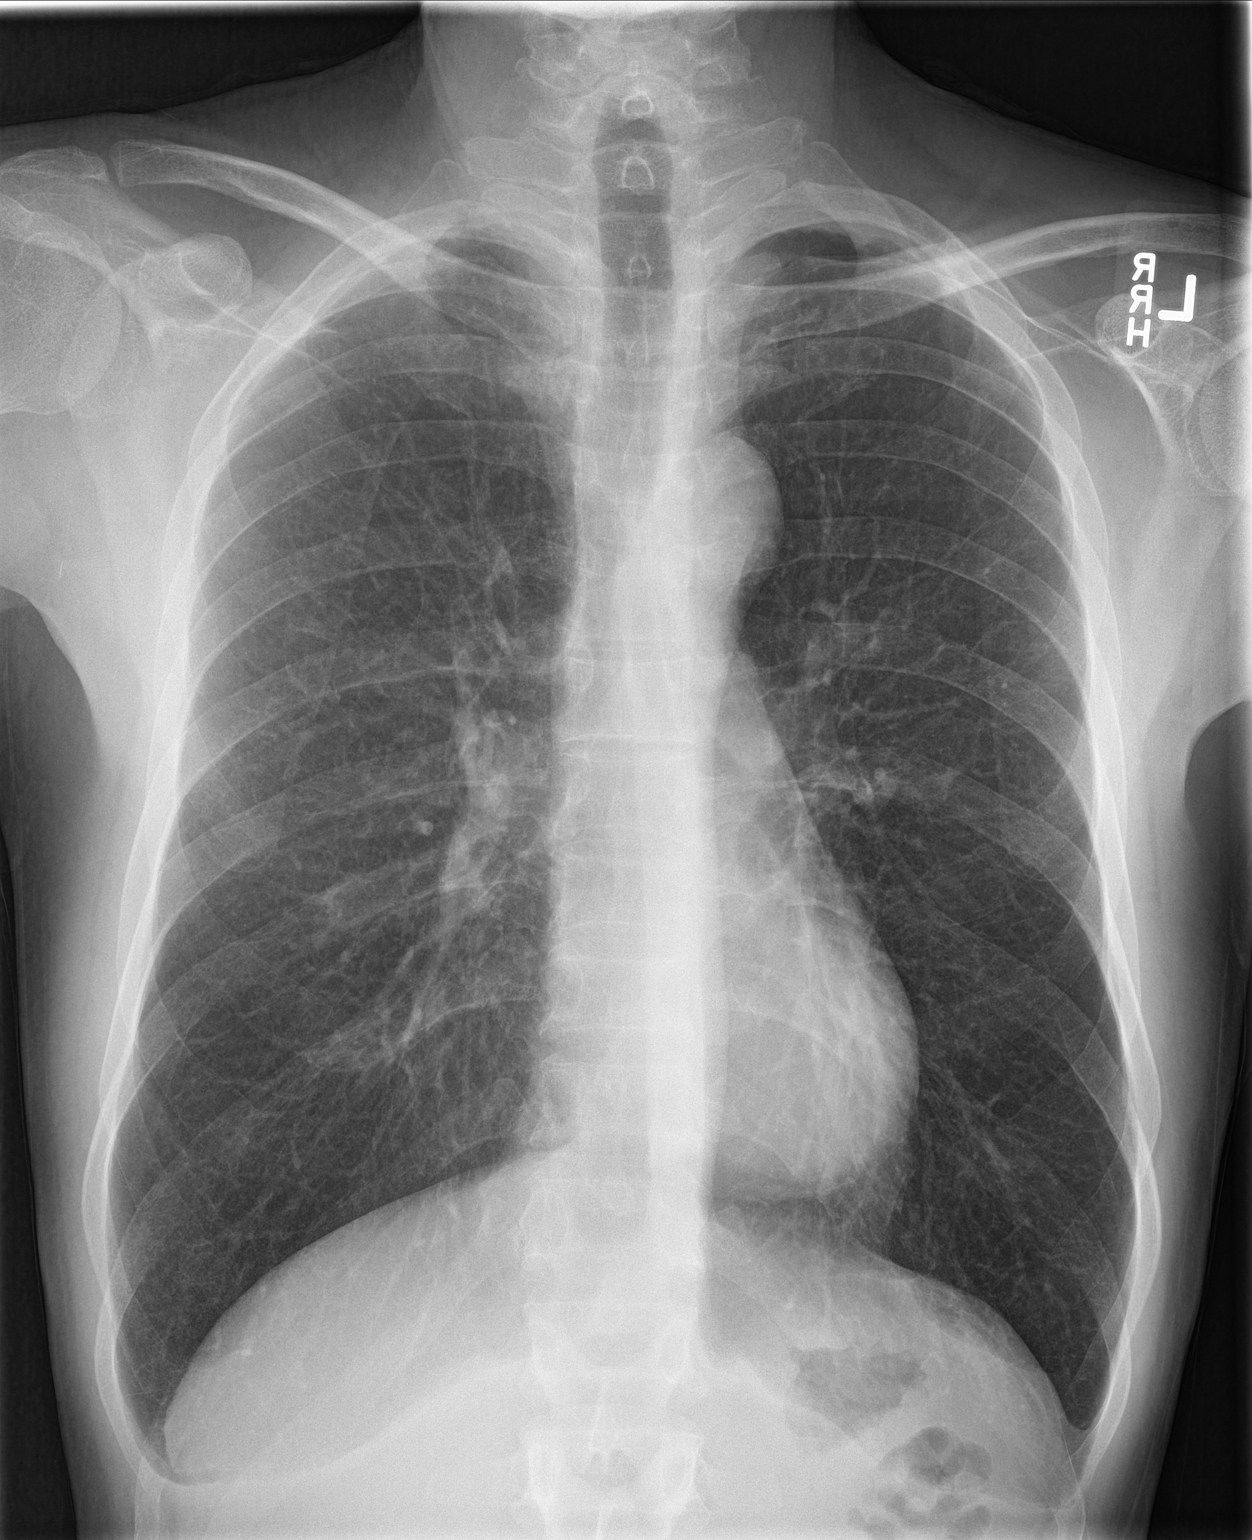

[chest lat]
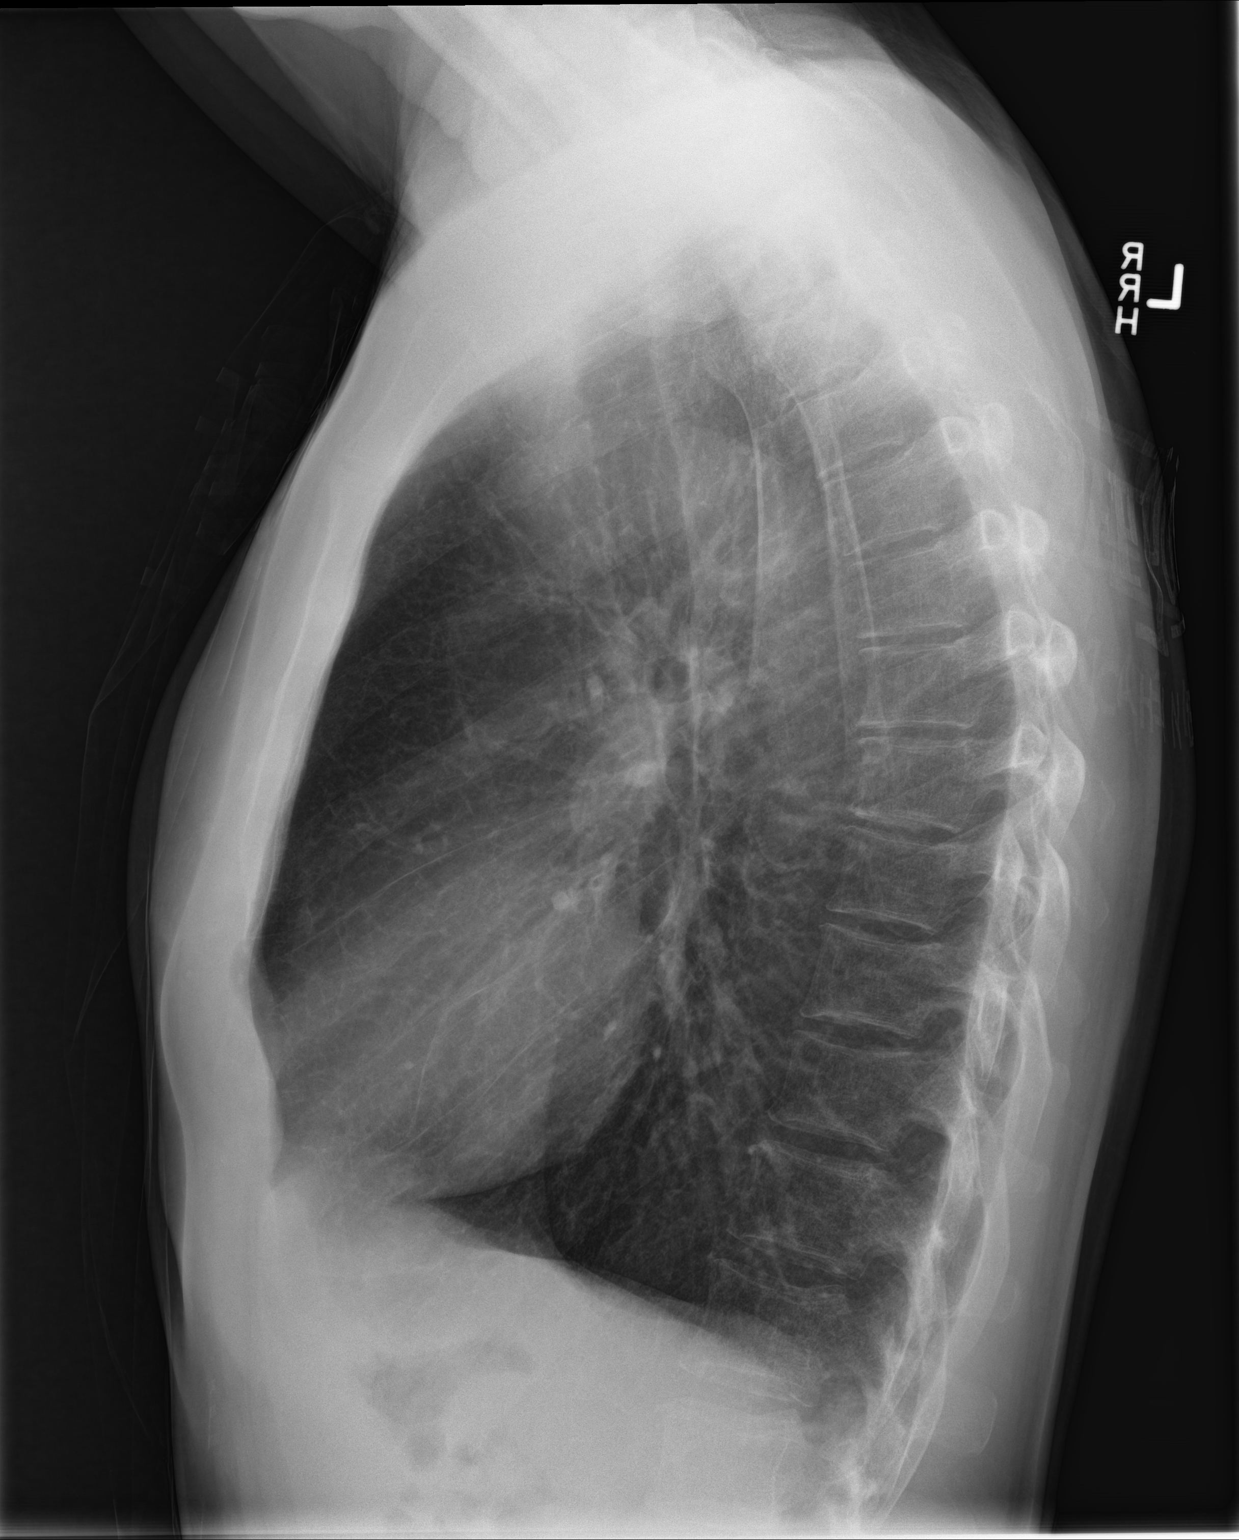

[2 of 2 positions shown; findings below may reference images not displayed]

FINDINGS: Cardiomediastinal silhouette is stable. Hyperinflation is noted.
Stable chronic mild bronchitic changes. No acute infiltrate or
pleural effusion. No pulmonary edema. Bony thorax is stable.
IMPRESSION: No active disease. Hyperinflation again noted. Stable central mild
bronchitic changes.

## 2017-05-23 NOTE — Progress Notes (Signed)
Office Visit Note  Patient: Richard Manning             Date of Birth: [birth year]           MRN: [id number]             PCP: Elfredia Nevins, MD Referring: Laverle Hobby, MD Visit Date: 05/28/2017 Occupation: Disability    Subjective:  Pain in hands   History of Present Illness: Richard Manning is a 62 y.o. male  seen in consultation per request of Dr. Wende Crease.according to patient the symptoms is started in 1998 with pain in his hands. The following year he got worse and could not even get out of his bed. He states he was seen at the [facility] where Dr. Kellie Simmering evaluated him and did some blood work. He diagnosed him with rheumatoid arthritis and started him on methotrexate, folic acid and prednisone.He was tried on combination therapy with sulfasalazine and Plaquenil but he could not tolerate both medications. He states she's been on methotrexate for multiple years with prednisone taper when necessary. After Dr. Kellie Simmering retired he got some methotrexate for 3 months from urgent care. He's been out of methotrexate for the last 3 months and has been taking prednisone from 10-20 mg a day. He reports pain and discomfort in his bilateral hands and his feet. He has occasional discomfort in his right shoulder.    Activities of Daily Living:  Patient reports morning stiffness for 2 hour.   Patient Reports nocturnal pain.  Difficulty dressing/grooming: Denies Difficulty climbing stairs: Denies Difficulty getting out of chair: Reports Difficulty using hands for taps, buttons, cutlery, and/or writing: Reports   Review of Systems  Constitutional: Positive for fatigue. Negative for night sweats and weakness.  HENT: Negative for mouth sores, mouth dryness and nose dryness.   Eyes: Negative for redness, visual disturbance and dryness.  Respiratory: Negative for cough, hemoptysis, shortness of breath and difficulty breathing.   Cardiovascular: Negative for chest pain, palpitations,  hypertension, irregular heartbeat and swelling in legs/feet.  Gastrointestinal: Negative for blood in stool, constipation and diarrhea.  Endocrine: Negative for increased urination.  Genitourinary: Negative for painful urination.  Musculoskeletal: Positive for arthralgias, joint pain, joint swelling, morning stiffness and muscle tenderness. Negative for myalgias, muscle weakness and myalgias.  Skin: Positive for nodules/bumps. Negative for color change, pallor, rash, hair loss, redness, skin tightness, ulcers and sensitivity to sunlight.  Allergic/Immunologic: Negative for susceptible to infections.  Neurological: Negative for dizziness, fainting, memory loss and night sweats.  Hematological: Negative for swollen glands.  Psychiatric/Behavioral: Negative for depressed mood and sleep disturbance. The patient is not nervous/anxious.     PMFS History:  There are no active problems to display for this patient.   Past Medical History:  Diagnosis Date  . RA (rheumatoid arthritis) (HCC)     Family History  Problem Relation Age of Onset  . Atrial fibrillation Mother   . Cancer Mother        breast    History reviewed. No pertinent surgical history. Social History   Social History Narrative  . Not on file     Objective: Vital Signs: BP 120/68 (BP Location: Left Arm, Patient Position: Sitting, Cuff Size: Normal)   Pulse 77   Resp 15   Ht 5\' 9"  (1.753 m)   Wt 130 lb (59 kg)   BMI 19.20 kg/m    Physical Exam  Constitutional: He is oriented to person, place, and time. He appears well-developed and well-nourished.  HENT:  Head: Normocephalic and atraumatic.  Eyes: Conjunctivae and EOM are normal. Pupils are equal, round, and reactive to light.  Neck: Normal range of motion. Neck supple.  Cardiovascular: Normal rate, regular rhythm and normal heart sounds.  Pulmonary/Chest: Effort normal and breath sounds normal.  Abdominal: Soft. Bowel sounds are normal.  Neurological: He is  alert and oriented to person, place, and time.  Skin: Skin is warm and dry. Capillary refill takes less than 2 seconds.  Psychiatric: He has a normal mood and affect. His behavior is normal.  Nursing note and vitals reviewed.    Musculoskeletal Exam: C-spine and thoracic lumbar spine good range of motion. He had discomfort range of motion of his right shoulder. Elbow joints wrist joint MCPs PIPs DIPs showed synovitis as described below. He has some discomfort range of motion of his right hip. Right knee joint had limited extension and some warmth on palpation. He also had some MTP joint tenderness and synovitis as described below.  CDAI Exam: CDAI Homunculus Exam:   Tenderness:  RUE: glenohumeral Right hand: 1st MCP, 2nd MCP, 5th MCP, 2nd PIP, 3rd PIP, 4th PIP and 5th PIP Left hand: 1st MCP, 2nd MCP, 3rd MCP and 5th MCP RLE: tibiofemoral Right foot: 1st MTP and 3rd MTP Left foot: 4th MTP and 5th MTP  Swelling:  Right hand: 1st MCP, 2nd MCP, 5th MCP, 2nd PIP and 4th PIP Left hand: 1st MCP, 2nd MCP, 3rd MCP, 5th MCP, 2nd PIP, 4th PIP and 5th PIP RLE: tibiofemoral Right foot: 1st MTP and 3rd MTP Left foot: 4th MTP and 5th MTP  Joint Counts:  CDAI Tender Joint count: 13 CDAI Swollen Joint count: 13  Global Assessments:  Patient Global Assessment: 6 Provider Global Assessment: 6  CDAI Calculated Score: 38    Investigation: No additional findings.   Imaging: Xr Foot 2 Views Left  Result Date: 05/28/2017 All MTP joint narrowing was noted. Generalized osteopenia was noted. He appears to have callus formation in the second and third metatarsal shafts. Severe erosive changes were noted in the fifth MTP joint. No significant intertarsal joint space narrowing was noted. Tibiotalar joint space narrowing was noted. Inferior calcaneal spur was noted. Impression: These findings are consistent with erosive rheumatoid arthritis.  Xr Foot 2 Views Right  Result Date: 05/28/2017 All MTP  joint severe narrowing was noted. Generalized osteopenia was noted. Severe erosive changes were noted in the third fourth and fifth MTP joints. No significant intertarsal joint space narrowing was noted. Some tibiotalar joint space narrowing was noted. Impression: These findings are consistent with rheumatoid arthritis of the foot.  Xr Hand 2 View Left  Result Date: 05/28/2017 Juxta articular osteopenia was noted. First second and third MCP joint narrowing was noted. PIP and DIP joint narrowing was noted. Erosive change was noted in the fifth MCP , erosive versus cystic changes were noted in the second and third MCP joint. Erosions in the carpal joint was also noted. Impression: These findings are consistent with erosive rheumatoid arthritis.  Xr Hand 2 View Right  Result Date: 05/28/2017 All PIP and DIP joint space narrowing was noted. Severe narrowing of the second MCP joint with invagination was noted. Juxta articular osteopenia was noted. Narrowing of all MCP joints was noted. Erosive changes were noted and second MCP jointand second PIP joint. Intercarpal and radiocarpal joint space narrowing was noted. Cystic versus erosive changes were noted in the radius. Impression: These findings are consistent with erosive rheumatoid arthritis.  Xr Knee 3  View Right  Result Date: 05/28/2017 Mild medial compartment narrowing was noted. No chondrocalcinosis was noted. Moderate patellofemoral narrowing was noted. Impression: These findings consistent with mild osteoarthritis of the knee joint and moderate chondromalacia patella  Xr Shoulder Right  Result Date: 05/28/2017 No glenohumeral joint space narrowing was noted. No chondrocalcinosis was noted. No significant acromioclavicular joint space narrowing was noted. Impression: Unremarkable x-ray of the shoulder joint.   Speciality Comments: No specialty comments available.    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  Rheumatoid arthritis with rheumatoid factor of multiple sites without organ or systems involvement (HCC) -severe erosive rheumatoid arthritis with nodulosis. Dx by Dr. Kellie Simmering for many years with methotrexate and prednisone.-he has active synovitis in multiple joints today. He has been treated with methotrexate and prednisone combination by Dr. Kellie Simmering for multiple years. Detailed counseling regarding rheumatoid arthritis was provided.Different treatment options and their side effects were discussed. He wants to stay on methotrexate for right now. Handout was given consent was taken. Once the labs are available we will call in his prescription for methotrexate. He's been taking methotrexate 5 tablets by mouth twice a week. Based on his exam he will need more aggressive therapy in future. As he's been on long-term prednisone I will keep him on prednisone 10 mg by mouth daily. Hopefully eventually we should be able to taper him off prednisone. Plan: Sedimentation rate, Rheumatoid factor, Cyclic citrul peptide antibody, IgG, ANA  High risk medication use - MTX: 5 tablets po on Thursdays and 5 tablets po on Fridayspreviously on: PLQ and SSZ - Plan: CBC with Differential/Platelet, COMPLETE METABOLIC PANEL WITH GFR, Urinalysis, Routine w reflex microscopic, Hepatitis B core antibody, IgM, Hepatitis B surface antigen, Hepatitis C antibody, HIV antibody, QuantiFERON-TB Gold Plus, Serum protein electrophoresis with reflex, IgG, IgA, IgM  On prednisone therapy - Prednisone 10 mg daily - Plan: VITAMIN D 25 Hydroxy (Vit-D Deficiency, Fractures) He will need a DEXA scan in future.  Chronic right shoulder pain - Plan: XR Shoulder Right  Pain in both hands - Plan: XR Hand 2 View Right, XR Hand 2 View Left  Chronic pain of right knee - Plan: XR KNEE 3 VIEW RIGHT  Pain in both feet  - Plan: XR Foot 2 Views Right, XR Foot 2 Views Left  Smoker : Smoking cessation was discussed at length.   Orders: Orders Placed This Encounter  Procedures  . XR Hand 2 View Right  . XR Hand 2 View Left  . XR KNEE 3 VIEW RIGHT  . XR Foot 2 Views Right  . XR Foot 2 Views Left  . XR Shoulder Right  . CBC with Differential/Platelet  . COMPLETE METABOLIC PANEL WITH GFR  . Urinalysis, Routine w reflex microscopic  . Sedimentation rate  . Rheumatoid factor  . Cyclic citrul peptide antibody, IgG  . ANA  . Hepatitis B core antibody, IgM  . Hepatitis B surface antigen  . Hepatitis C antibody  . HIV antibody  . QuantiFERON-TB Gold Plus  . Serum protein electrophoresis with reflex  . IgG, IgA, IgM  . VITAMIN D 25 Hydroxy (Vit-D Deficiency, Fractures)   Meds ordered this encounter  Medications  . predniSONE (DELTASONE) 10 MG tablet    Sig: Take 1 tablet (10 mg total) by mouth daily with breakfast.    Dispense:  30 tablet  Refill:  2    Face-to-face time spent with patient was  . Greater than50% of time was spent in counseling and coordination of care.  Follow-Up Instructions: Return for Rheumatoid arthritis.   Pollyann Savoy, MD  Note - This record has been created using Animal nutritionist.  Chart creation errors have been sought, but may not always  have been located. Such creation errors do not reflect on  the standard of medical care.

## 2017-05-28 ENCOUNTER — Ambulatory Visit (INDEPENDENT_AMBULATORY_CARE_PROVIDER_SITE_OTHER): Payer: Medicare Other | Admitting: Rheumatology

## 2017-05-28 ENCOUNTER — Ambulatory Visit (INDEPENDENT_AMBULATORY_CARE_PROVIDER_SITE_OTHER): Payer: Self-pay

## 2017-05-28 ENCOUNTER — Encounter: Payer: Self-pay | Admitting: Rheumatology

## 2017-05-28 VITALS — BP 120/68 | HR 77 | Resp 15 | Ht 69.0 in | Wt 130.0 lb

## 2017-05-28 DIAGNOSIS — Z79899 Other long term (current) drug therapy: Secondary | ICD-10-CM | POA: Diagnosis not present

## 2017-05-28 DIAGNOSIS — M0630 Rheumatoid nodule, unspecified site: Secondary | ICD-10-CM | POA: Diagnosis not present

## 2017-05-28 DIAGNOSIS — M0579 Rheumatoid arthritis with rheumatoid factor of multiple sites without organ or systems involvement: Secondary | ICD-10-CM

## 2017-05-28 DIAGNOSIS — F17200 Nicotine dependence, unspecified, uncomplicated: Secondary | ICD-10-CM

## 2017-05-28 DIAGNOSIS — G8929 Other chronic pain: Secondary | ICD-10-CM | POA: Diagnosis not present

## 2017-05-28 DIAGNOSIS — M79642 Pain in left hand: Secondary | ICD-10-CM | POA: Diagnosis not present

## 2017-05-28 DIAGNOSIS — M79671 Pain in right foot: Secondary | ICD-10-CM | POA: Diagnosis not present

## 2017-05-28 DIAGNOSIS — M79641 Pain in right hand: Secondary | ICD-10-CM

## 2017-05-28 DIAGNOSIS — M25511 Pain in right shoulder: Secondary | ICD-10-CM | POA: Diagnosis not present

## 2017-05-28 DIAGNOSIS — M79672 Pain in left foot: Secondary | ICD-10-CM

## 2017-05-28 DIAGNOSIS — M25561 Pain in right knee: Secondary | ICD-10-CM

## 2017-05-28 DIAGNOSIS — Z7952 Long term (current) use of systemic steroids: Secondary | ICD-10-CM

## 2017-05-28 MED ORDER — PREDNISONE 10 MG PO TABS: 10.0000 mg | ORAL_TABLET | Freq: Every day | ORAL | 2 refills | Status: DC

## 2017-05-28 MED FILL — predniSONE 10 MG TABS: 10 | 30 days supply | Qty: 30 | Fill #0

## 2017-05-28 NOTE — Patient Instructions (Addendum)
Methotrexate tablets What is this medicine? METHOTREXATE (METH oh TREX ate) is a chemotherapy drug used to treat cancer including breast cancer, leukemia, and lymphoma. This medicine can also be used to treat psoriasis and certain kinds of arthritis. This medicine may be used for other purposes; ask your health care provider or pharmacist if you have questions. COMMON BRAND NAME(S): Rheumatrex, Trexall What should I tell my health care provider before I take this medicine? They need to know if you have any of these conditions: -fluid in the stomach area or lungs -if you often drink alcohol -infection or immune system problems -kidney disease or on hemodialysis -liver disease -low blood counts, like low white cell, platelet, or red cell counts -lung disease -radiation therapy -stomach ulcers -ulcerative colitis -an unusual or allergic reaction to methotrexate, other medicines, foods, dyes, or preservatives -pregnant or trying to get pregnant -breast-feeding How should I use this medicine? Take this medicine by mouth with a glass of water. Follow the directions on the prescription label. Take your medicine at regular intervals. Do not take it more often than directed. Do not stop taking except on your doctor's advice. Make sure you know why you are taking this medicine and how often you should take it. If this medicine is used for a condition that is not cancer, like arthritis or psoriasis, it should be taken weekly, NOT daily. Taking this medicine more often than directed can cause serious side effects, even death. Talk to your healthcare provider about safe handling and disposal of this medicine. You may need to take special precautions. Talk to your pediatrician regarding the use of this medicine in children. While this drug may be prescribed for selected conditions, precautions do apply. Overdosage: If you think you have taken too much of this medicine contact a poison control center or  emergency room at once. NOTE: This medicine is only for you. Do not share this medicine with others. What if I miss a dose? If you miss a dose, talk with your doctor or health care professional. Do not take double or extra doses. What may interact with this medicine? This medicine may interact with the following medication: -acitretin -aspirin and aspirin-like medicines including salicylates -azathioprine -certain antibiotics like penicillins, tetracycline, and chloramphenicol -cyclosporine -gold -hydroxychloroquine -live virus vaccines -NSAIDs, medicines for pain and inflammation, like ibuprofen or naproxen -other cytotoxic agents -penicillamine -phenylbutazone -phenytoin -probenecid -retinoids such as isotretinoin and tretinoin -steroid medicines like prednisone or cortisone -sulfonamides like sulfasalazine and trimethoprim/sulfamethoxazole -theophylline This list may not describe all possible interactions. Give your health care provider a list of all the medicines, herbs, non-prescription drugs, or dietary supplements you use. Also tell them if you smoke, drink alcohol, or use illegal drugs. Some items may interact with your medicine. What should I watch for while using this medicine? Avoid alcoholic drinks. This medicine can make you more sensitive to the sun. Keep out of the sun. If you cannot avoid being in the sun, wear protective clothing and use sunscreen. Do not use sun lamps or tanning beds/booths. You may need blood work done while you are taking this medicine. Call your doctor or health care professional for advice if you get a fever, chills or sore throat, or other symptoms of a cold or flu. Do not treat yourself. This drug decreases your body's ability to fight infections. Try to avoid being around people who are sick. This medicine may increase your risk to bruise or bleed. Call your doctor or health care professional   if you notice any unusual bleeding. Check with your  doctor or health care professional if you get an attack of severe diarrhea, nausea and vomiting, or if you sweat a lot. The loss of too much body fluid can make it dangerous for you to take this medicine. Talk to your doctor about your risk of cancer. You may be more at risk for certain types of cancers if you take this medicine. Both men and women must use effective birth control with this medicine. Do not become pregnant while taking this medicine or until at least 1 normal menstrual cycle has occurred after stopping it. Women should inform their doctor if they wish to become pregnant or think they might be pregnant. Men should not father a child while taking this medicine and for 3 months after stopping it. There is a potential for serious side effects to an unborn child. Talk to your health care professional or pharmacist for more information. Do not breast-feed an infant while taking this medicine. What side effects may I notice from receiving this medicine? Side effects that you should report to your doctor or health care professional as soon as possible: -allergic reactions like skin rash, itching or hives, swelling of the face, lips, or tongue -breathing problems or shortness of breath -diarrhea -dry, nonproductive cough -low blood counts - this medicine may decrease the number of white blood cells, red blood cells and platelets. You may be at increased risk for infections and bleeding. -mouth sores -redness, blistering, peeling or loosening of the skin, including inside the mouth -signs of infection - fever or chills, cough, sore throat, pain or trouble passing urine -signs and symptoms of bleeding such as bloody or black, tarry stools; red or dark-brown urine; spitting up blood or brown material that looks like coffee grounds; red spots on the skin; unusual bruising or bleeding from the eye, gums, or nose -signs and symptoms of kidney injury like trouble passing urine or change in the amount  of urine -signs and symptoms of liver injury like dark yellow or brown urine; general ill feeling or flu-like symptoms; light-colored stools; loss of appetite; nausea; right upper belly pain; unusually weak or tired; yellowing of the eyes or skin Side effects that usually do not require medical attention (report to your doctor or health care professional if they continue or are bothersome): -dizziness -hair loss -tiredness -upset stomach -vomiting This list may not describe all possible side effects. Call your doctor for medical advice about side effects. You may report side effects to FDA at 1-800-FDA-1088. Where should I keep my medicine? Keep out of the reach of children. Store at room temperature between 20 and 25 degrees C (68 and 77 degrees F). Protect from light. Throw away any unused medicine after the expiration date. NOTE: This sheet is a summary. It may not cover all possible information. If you have questions about this medicine, talk to your doctor, pharmacist, or health care provider.  2018 [employer]vier/Gold Standard (2014-12-19 05:39:22) Standing Labs We placed an order today for your standing lab work.    Please come back and get your standing labs in  Weeks after starting methotrexate and then every 3 months  We have open lab Monday through Friday from 8:30-11:30 AM and 1:30-4 PM at the office of Dr. Pollyann Savoy.   The office is located at [address], [place], [place] [address] No appointment is necessary.   Labs are drawn by [facility].  You may receive a bill  from [facility] for your lab work. If you have any questions regarding directions or hours of operation,  please call [phone].

## 2017-05-30 ENCOUNTER — Telehealth: Payer: Self-pay | Admitting: *Deleted

## 2017-05-30 DIAGNOSIS — E559 Vitamin D deficiency, unspecified: Secondary | ICD-10-CM

## 2017-05-30 LAB — HEPATITIS C ANTIBODY
Hepatitis C Ab: NONREACTIVE
SIGNAL TO CUT-OFF: 0.01 (ref <1.00)

## 2017-05-30 LAB — PROTEIN ELECTROPHORESIS, SERUM, WITH REFLEX
ALBUMIN ELP: 4 g/dL (ref 3.8–4.8)
Alpha 1: 0.3 g/dL (ref 0.2–0.3)
Alpha 2: 0.9 g/dL (ref 0.5–0.9)
BETA 2: 0.5 g/dL (ref 0.2–0.5)
BETA GLOBULIN: 0.6 g/dL (ref 0.4–0.6)
Gamma Globulin: 1.4 g/dL (ref 0.8–1.7)
TOTAL PROTEIN: 7.7 g/dL (ref 6.1–8.1)

## 2017-05-30 LAB — COMPLETE METABOLIC PANEL WITH GFR
AG RATIO: 1.2 (calc) (ref 1.0–2.5)
ALT: 12 U/L (ref 9–46)
AST: 16 U/L (ref 10–35)
Albumin: 4.2 g/dL (ref 3.6–5.1)
Alkaline phosphatase (APISO): 83 U/L (ref 40–115)
BUN: 12 mg/dL (ref 7–25)
CALCIUM: 10 mg/dL (ref 8.6–10.3)
CO2: 27 mmol/L (ref 20–32)
CREATININE: 0.89 mg/dL (ref 0.70–1.33)
Chloride: 105 mmol/L (ref 98–110)
GFR, EST AFRICAN AMERICAN: 108 mL/min/{1.73_m2} (ref >=60)
GFR, Est Non African American: 94 mL/min/{1.73_m2} (ref >=60)
GLOBULIN: 3.4 g/dL (ref 1.9–3.7)
Glucose, Bld: 90 mg/dL (ref 65–99)
POTASSIUM: 4.1 mmol/L (ref 3.5–5.3)
SODIUM: 138 mmol/L (ref 135–146)
Total Bilirubin: 0.4 mg/dL (ref 0.2–1.2)
Total Protein: 7.6 g/dL (ref 6.1–8.1)

## 2017-05-30 LAB — CYCLIC CITRUL PEPTIDE ANTIBODY, IGG: Cyclic Citrullin Peptide Ab: >250 UNITS — ABNORMAL HIGH

## 2017-05-30 LAB — CBC WITH DIFFERENTIAL/PLATELET
Basophils Absolute: 44 cells/uL (ref 0–200)
Basophils Relative: 0.5 %
EOS PCT: 1.5 %
Eosinophils Absolute: 132 cells/uL (ref 15–500)
HEMATOCRIT: 49.6 % (ref 38.5–50.0)
HEMOGLOBIN: 17.5 g/dL — AB (ref 13.2–17.1)
LYMPHS ABS: 2402 {cells}/uL (ref 850–3900)
MCH: 33.2 pg — ABNORMAL HIGH (ref 27.0–33.0)
MCHC: 35.3 g/dL (ref 32.0–36.0)
MCV: 94.1 fL (ref 80.0–100.0)
MPV: 9.9 fL (ref 7.5–12.5)
Monocytes Relative: 7.6 %
NEUTROS ABS: 5553 {cells}/uL (ref 1500–7800)
Neutrophils Relative %: 63.1 %
Platelets: 247 10*3/uL (ref 140–400)
RBC: 5.27 10*6/uL (ref 4.20–5.80)
RDW: 11.7 % (ref 11.0–15.0)
Total Lymphocyte: 27.3 %
WBC: 8.8 10*3/uL (ref 3.8–10.8)
WBCMIX: 669 {cells}/uL (ref 200–950)

## 2017-05-30 LAB — URINALYSIS, ROUTINE W REFLEX MICROSCOPIC
BILIRUBIN URINE: NEGATIVE
Bacteria, UA: NONE SEEN /HPF
GLUCOSE, UA: NEGATIVE
Hgb urine dipstick: NEGATIVE
Hyaline Cast: NONE SEEN /LPF
KETONES UR: NEGATIVE
NITRITE: NEGATIVE
PROTEIN: NEGATIVE
RBC / HPF: NONE SEEN /HPF (ref 0–2)
SQUAMOUS EPITHELIAL / LPF: NONE SEEN /HPF (ref <=5)
Specific Gravity, Urine: 1.008 (ref 1.001–1.03)
pH: 7 (ref 5.0–8.0)

## 2017-05-30 LAB — ANA: Anti Nuclear Antibody(ANA): NEGATIVE

## 2017-05-30 LAB — VITAMIN D 25 HYDROXY (VIT D DEFICIENCY, FRACTURES): Vit D, 25-Hydroxy: 25 ng/mL — ABNORMAL LOW (ref 30–100)

## 2017-05-30 LAB — IGG, IGA, IGM
IGG (IMMUNOGLOBIN G), SERUM: 1279 mg/dL (ref 694–1618)
IGM, SERUM: 305 mg/dL — AB (ref 48–271)
Immunoglobulin A: 532 mg/dL — ABNORMAL HIGH (ref 81–463)

## 2017-05-30 LAB — RHEUMATOID FACTOR

## 2017-05-30 LAB — HEPATITIS B SURFACE ANTIGEN: HEP B S AG: NONREACTIVE

## 2017-05-30 LAB — QUANTIFERON-TB GOLD PLUS
Mitogen-NIL: >10 IU/mL
NIL: 0.05 [IU]/mL
QuantiFERON-TB Gold Plus: NEGATIVE
TB1-NIL: <0 IU/mL
TB2-NIL: 0.01 IU/mL

## 2017-05-30 LAB — HEPATITIS B CORE ANTIBODY, IGM: Hep B C IgM: NONREACTIVE

## 2017-05-30 LAB — SEDIMENTATION RATE: Sed Rate: 19 mm/h (ref 0–20)

## 2017-05-30 LAB — HIV ANTIBODY (ROUTINE TESTING W REFLEX): HIV 1&2 Ab, 4th Generation: NONREACTIVE

## 2017-05-30 MED ORDER — FOLIC ACID 1 MG PO TABS: 2.0000 mg | ORAL_TABLET | Freq: Every day | ORAL | 3 refills | Status: DC

## 2017-05-30 MED ORDER — METHOTREXATE 2.5 MG PO TABS: ORAL_TABLET | ORAL | 0 refills | Status: DC

## 2017-05-30 MED ORDER — VITAMIN D (ERGOCALCIFEROL) 1.25 MG (50000 UNIT) PO CAPS: 50000.0000 [IU] | ORAL_CAPSULE | ORAL | 0 refills | Status: DC

## 2017-05-30 MED FILL — VIT D2 1.25 MG (50,000 UNIT: 1.25 MG | 84 days supply | Qty: 12 | Fill #0

## 2017-05-30 MED FILL — METHOTREXATE 2.5 MG TABLET: 2.5 | 84 days supply | Qty: 120 | Fill #0

## 2017-05-30 MED FILL — FOLIC ACID 1 MG TABLET: 1 | 90 days supply | Qty: 180 | Fill #0

## 2017-05-30 NOTE — Progress Notes (Signed)
Vitamin D 50,000 units once a week for 3 months.  Recheck vitamin D level in 3 months.

## 2017-05-30 NOTE — Telephone Encounter (Signed)
-----   Message from Pollyann Savoy, MD sent at 05/30/2017  1:46 PM EST ----- Vitamin D 50,000 units once a week for 3 months.  Recheck vitamin D level in 3 months.

## 2017-06-11 ENCOUNTER — Telehealth: Payer: Self-pay | Admitting: Rheumatology

## 2017-06-11 DIAGNOSIS — Z79899 Other long term (current) drug therapy: Secondary | ICD-10-CM | POA: Diagnosis not present

## 2017-06-11 LAB — COMPLETE METABOLIC PANEL WITH GFR
AG RATIO: 1.2 (calc) (ref 1.0–2.5)
ALBUMIN MSPROF: 3.7 g/dL (ref 3.6–5.1)
ALT: 13 U/L (ref 9–46)
AST: 16 U/L (ref 10–35)
Alkaline phosphatase (APISO): 69 U/L (ref 40–115)
BUN: 10 mg/dL (ref 7–25)
CALCIUM: 9.1 mg/dL (ref 8.6–10.3)
CO2: 25 mmol/L (ref 20–32)
CREATININE: 0.87 mg/dL (ref 0.70–1.33)
Chloride: 107 mmol/L (ref 98–110)
GFR, EST AFRICAN AMERICAN: 109 mL/min/{1.73_m2} (ref >=60)
GFR, EST NON AFRICAN AMERICAN: 94 mL/min/{1.73_m2} (ref >=60)
GLOBULIN: 3 g/dL (ref 1.9–3.7)
Glucose, Bld: 86 mg/dL (ref 65–139)
POTASSIUM: 3.8 mmol/L (ref 3.5–5.3)
SODIUM: 141 mmol/L (ref 135–146)
TOTAL PROTEIN: 6.7 g/dL (ref 6.1–8.1)
Total Bilirubin: 1 mg/dL (ref 0.2–1.2)

## 2017-06-11 LAB — CBC WITH DIFFERENTIAL/PLATELET
BASOS ABS: 43 {cells}/uL (ref 0–200)
BASOS PCT: 0.5 %
EOS ABS: 146 {cells}/uL (ref 15–500)
Eosinophils Relative: 1.7 %
HCT: 46.8 % (ref 38.5–50.0)
HEMOGLOBIN: 16.4 g/dL (ref 13.2–17.1)
Lymphs Abs: 2606 cells/uL (ref 850–3900)
MCH: 32.2 pg (ref 27.0–33.0)
MCHC: 35 g/dL (ref 32.0–36.0)
MCV: 91.9 fL (ref 80.0–100.0)
MONOS PCT: 7.2 %
MPV: 9.7 fL (ref 7.5–12.5)
Neutro Abs: 5186 cells/uL (ref 1500–7800)
Neutrophils Relative %: 60.3 %
PLATELETS: 210 10*3/uL (ref 140–400)
RBC: 5.09 10*6/uL (ref 4.20–5.80)
RDW: 12.2 % (ref 11.0–15.0)
TOTAL LYMPHOCYTE: 30.3 %
WBC mixed population: 619 cells/uL (ref 200–950)
WBC: 8.6 10*3/uL (ref 3.8–10.8)

## 2017-06-11 NOTE — Telephone Encounter (Signed)
Order's released

## 2017-06-11 NOTE — Telephone Encounter (Signed)
Patient going to [facility] in [place] today. Please release lab orders.

## 2017-06-12 NOTE — Progress Notes (Signed)
Office Visit Note  Patient: Richard Manning             Date of Birth: [birth year]           MRN: [id number]             PCP: Elfredia Nevins, MD Referring: Elfredia Nevins, MD Visit Date: 06/26/2017 Occupation: @GUAROCC @    Subjective:  Medication Management   History of Present Illness: [name] is a 62 y.o. male with history of seropositive rheumatoid arthritis.  He states currently he is doing quite well.  He had recent flare off medications but after starting medications the swelling  swelling and pain has gone down.  He is able to make a fist without any problems.  None of the other joints are painful.  Patient states that he restarted methotrexate about 4 weeks ago.  Activities of Daily Living:  Patient reports morning stiffness for 2 hours.   Patient Denies nocturnal pain.  Difficulty dressing/grooming: Reports Difficulty climbing stairs: Denies Difficulty getting out of chair: Reports Difficulty using hands for taps, buttons, cutlery, and/or writing: Reports   Review of Systems  Constitutional: Positive for fatigue. Negative for night sweats and weakness ( ).  HENT: Negative for mouth sores, mouth dryness and nose dryness.   Eyes: Negative for redness and dryness.  Respiratory: Negative for shortness of breath and difficulty breathing.   Cardiovascular: Negative for chest pain, palpitations, hypertension, irregular heartbeat and swelling in legs/feet.  Gastrointestinal: Negative for abdominal pain, constipation, diarrhea and nausea.  Endocrine: Positive for cold intolerance. Negative for increased urination.  Genitourinary: Negative for pelvic pain.  Musculoskeletal: Positive for morning stiffness. Negative for arthralgias, joint pain, joint swelling, myalgias, muscle weakness, muscle tenderness and myalgias.  Skin: Negative for color change, rash, hair loss, nodules/bumps, skin tightness, ulcers and sensitivity to sunlight.  Allergic/Immunologic: Negative  for susceptible to infections.  Neurological: Negative for dizziness, fainting, numbness, headaches, memory loss and night sweats.  Hematological: Negative for bruising/bleeding tendency and swollen glands.  Psychiatric/Behavioral: Negative for depressed mood and sleep disturbance. The patient is not nervous/anxious.     PMFS History:  There are no active problems to display for this patient.   Past Medical History:  Diagnosis Date  . RA (rheumatoid arthritis) (HCC)     Family History  Problem Relation Age of Onset  . Atrial fibrillation Mother   . Cancer Mother        breast    History reviewed. No pertinent surgical history. Social History   Social History Narrative  . Not on file     Objective: Vital Signs: BP 119/78 (BP Location: Left Arm, Patient Position: Sitting, Cuff Size: Normal)   Pulse (!) 59   Resp 17   Ht 5\' 8"  (1.727 m)   Wt 130 lb (59 kg)   BMI 19.77 kg/m    Physical Exam  Constitutional: He is oriented to person, place, and time. He appears well-developed and well-nourished.  HENT:  Head: Normocephalic and atraumatic.  Eyes: Conjunctivae and EOM are normal. Pupils are equal, round, and reactive to light.  Neck: Normal range of motion. Neck supple.  Cardiovascular: Normal rate, regular rhythm and normal heart sounds.  Pulmonary/Chest: Effort normal and breath sounds normal.  Abdominal: Soft. Bowel sounds are normal.  Neurological: He is alert and oriented to person, place, and time.  Skin: Skin is warm and dry. Capillary refill takes less than 2 seconds.  Psychiatric: He has a normal mood and affect. His  behavior is normal.  Nursing note and vitals reviewed.    Musculoskeletal Exam: C-spine, thoracic and lumbar spine good range of motion.  Shoulders elbows wrist joints are good range of motion.  He has some tenosynovitis in the extensor tendons.  He has synovitis in several of his MCPs and PIPs as described below.  He also has warmth and swelling in  his right knee joint with a small effusion.  He has tenderness across his MTPs.  CDAI Exam: CDAI Homunculus Exam:   Tenderness:  Right hand: 1st MCP, 2nd MCP, 3rd MCP and 5th MCP Left hand: 1st MCP, 2nd MCP, 4th MCP and 5th MCP RLE: tibiofemoral  Swelling:  Right hand: 1st MCP, 2nd MCP, 3rd MCP and 5th MCP Left hand: 1st MCP, 2nd MCP, 4th MCP and 5th MCP RLE: tibiofemoral  Joint Counts:  CDAI Tender Joint count: 9 CDAI Swollen Joint count: 9  Global Assessments:  Patient Global Assessment: 5 Provider Global Assessment: 7  CDAI Calculated Score: 30    Investigation: No additional findings.  Component     Latest Ref Rng & Units 05/28/2017  Total Protein     6.1 - 8.1 g/dL 7.7  Albumin ELP     3.8 - 4.8 g/dL 4.0  Alpha 1     0.2 - 0.3 g/dL 0.3  Alpha 2     0.5 - 0.9 g/dL 0.9  Beta Globulin     0.4 - 0.6 g/dL 0.6  Beta 2     0.2 - 0.5 g/dL 0.5  Gamma Globulin     0.8 - 1.7 g/dL 1.4  SPE Interp.        QuantiFERON-TB Gold Plus     NEGATIVE NEGATIVE  NIL     IU/mL 0.05  Mitogen-NIL     IU/mL >10.00  TB1-NIL     IU/mL <0.00  TB2-NIL     IU/mL 0.01  Immunoglobulin A     81 - 463 mg/dL 725 (H)  IgG (Immunoglobin G), Serum     694 - 1,618 mg/dL 3,664  IgM, Serum     48 - 271 mg/dL 403 (H)  Hepatitis C Ab     NON-REACTI NON-REACTIVE  SIGNAL TO CUT-OFF     <1.00 0.01  Sed Rate     0 - 20 mm/h 19  RA Latex Turbid.     <14 IU/mL >1,000 (H)  Cyclic Citrullin Peptide Ab     UNITS >250 (H)  Anit Nuclear Antibody(ANA)     NEGATIVE NEGATIVE  Hep B Core Ab, IgM     NON-REACTI NON-REACTIVE  Hepatitis B Surface Ag     NON-REACTI NON-REACTIVE  HIV     NON-REACTI NON-REACTIVE  Vitamin D, 25-Hydroxy     30 - 100 ng/mL 25 (L)   CBC Latest Ref Rng & Units 06/11/2017 05/28/2017  WBC 3.8 - 10.8 Thousand/uL 8.6 8.8  Hemoglobin 13.2 - 17.1 g/dL 47.4 17.5(H)  Hematocrit 38.5 - 50.0 % 46.8 49.6  Platelets 140 - 400 Thousand/uL 210 247   CMP Latest Ref Rng & Units  06/11/2017 05/28/2017 05/28/2017  Glucose 65 - 139 mg/dL 86 - 90  BUN 7 - 25 mg/dL 10 - 12  Creatinine 2.59 - 1.33 mg/dL 5.63 - 8.75  Sodium 643 - 146 mmol/L 141 - 138  Potassium 3.5 - 5.3 mmol/L 3.8 - 4.1  Chloride 98 - 110 mmol/L 107 - 105  CO2 20 - 32 mmol/L 25 - 27  Calcium 8.6 - 10.3 mg/dL  9.1 - 10.0  Total Protein 6.1 - 8.1 g/dL 6.7 7.7 7.6  Total Bilirubin 0.2 - 1.2 mg/dL 1.0 - 0.4  AST 10 - 35 U/L 16 - 16  ALT 9 - 46 U/L 13 - 12    Imaging: Xr Foot 2 Views Left  Result Date: 05/28/2017 All MTP joint narrowing was noted. Generalized osteopenia was noted. He appears to have callus formation in the second and third metatarsal shafts. Severe erosive changes were noted in the fifth MTP joint. No significant intertarsal joint space narrowing was noted. Tibiotalar joint space narrowing was noted. Inferior calcaneal spur was noted. Impression: These findings are consistent with erosive rheumatoid arthritis.  Xr Foot 2 Views Right  Result Date: 05/28/2017 All MTP joint severe narrowing was noted. Generalized osteopenia was noted. Severe erosive changes were noted in the third fourth and fifth MTP joints. No significant intertarsal joint space narrowing was noted. Some tibiotalar joint space narrowing was noted. Impression: These findings are consistent with rheumatoid arthritis of the foot.  Xr Hand 2 View Left  Result Date: 05/28/2017 Juxta articular osteopenia was noted. First second and third MCP joint narrowing was noted. PIP and DIP joint narrowing was noted. Erosive change was noted in the fifth MCP , erosive versus cystic changes were noted in the second and third MCP joint. Erosions in the carpal joint was also noted. Impression: These findings are consistent with erosive rheumatoid arthritis.  Xr Hand 2 View Right  Result Date: 05/28/2017 All PIP and DIP joint space narrowing was noted. Severe narrowing of the second MCP joint with invagination was noted. Juxta articular osteopenia  was noted. Narrowing of all MCP joints was noted. Erosive changes were noted and second MCP jointand second PIP joint. Intercarpal and radiocarpal joint space narrowing was noted. Cystic versus erosive changes were noted in the radius. Impression: These findings are consistent with erosive rheumatoid arthritis.  Xr Knee 3 View Right  Result Date: 05/28/2017 Mild medial compartment narrowing was noted. No chondrocalcinosis was noted. Moderate patellofemoral narrowing was noted. Impression: These findings consistent with mild osteoarthritis of the knee joint and moderate chondromalacia patella  Xr Shoulder Right  Result Date: 05/28/2017 No glenohumeral joint space narrowing was noted. No chondrocalcinosis was noted. No significant acromioclavicular joint space narrowing was noted. Impression: Unremarkable x-ray of the shoulder joint.   Speciality Comments: No specialty comments available.    Procedures:  No procedures performed Allergies: Benadryl [diphenhydramine] and Peanuts [peanut oil]   Assessment / Plan:     Visit Diagnoses: Rheumatoid arthritis involving multiple sites with positive rheumatoid factor (HCC) - RF + and CCP +Hand and feet XR revealed erosive disease.  He started methotrexate about 8 weeks ago.  He still has active synovitis on exam.  He is aggressive erosive disease.  I discussed the nature of aggressive disease and the findings of his x-rays at length.  I discussed possible use of biologic DMARD's.  He declined.  I also discussed possibly adding Plaquenil or sulfasalazine to methotrexate.  He states he had side effects from sulfasalazine and meth Plaquenil in the past.  He does not want to add Biologics at this point.  He would like to wait until he returns a follow-up visit.  Rheumatoid nodulosis (HCC): History of present  High risk medication use - MTX 5 tablets on Thursdays and 5 tablets on Fridays, folic acid 2 mg dailyPrednisone 10 mg daily  - Plan: CBC with  Differential/Platelet, COMPLETE METABOLIC PANEL WITH GFR.  I will check  labs today and then every 3 months to monitor for drug toxicity.  On prednisone therapy - Prednisone 10 mg daily.  He is tapered off prednisone now.  Smoker: Smoking cessation was discussed at length.  Primary osteoarthritis of right knee - Moderate chondromalacia patella.  He had a small effusion in his right knee.  Vitamin D deficiency - 50,000 units once weekly    Orders: Orders Placed This Encounter  Procedures  . CBC with Differential/Platelet  . COMPLETE METABOLIC PANEL WITH GFR   No orders of the defined types were placed in this encounter.   Face-to-face time spent with patient was 30 minutes.  Greater than 50% of time was spent in counseling and coordination of care.  Follow-Up Instructions: Return in about 3 months (around 09/23/2017) for Rheumatoid arthritis.   Pollyann Savoy, MD  Note - This record has been created using Animal nutritionist.  Chart creation errors have been sought, but may not always  have been located. Such creation errors do not reflect on  the standard of medical care.

## 2017-06-26 ENCOUNTER — Encounter: Payer: Self-pay | Admitting: Rheumatology

## 2017-06-26 ENCOUNTER — Ambulatory Visit (INDEPENDENT_AMBULATORY_CARE_PROVIDER_SITE_OTHER): Payer: Medicare Other | Admitting: Rheumatology

## 2017-06-26 VITALS — BP 119/78 | HR 59 | Resp 17 | Ht 68.0 in | Wt 130.0 lb

## 2017-06-26 DIAGNOSIS — E559 Vitamin D deficiency, unspecified: Secondary | ICD-10-CM | POA: Diagnosis not present

## 2017-06-26 DIAGNOSIS — M1711 Unilateral primary osteoarthritis, right knee: Secondary | ICD-10-CM

## 2017-06-26 DIAGNOSIS — M0579 Rheumatoid arthritis with rheumatoid factor of multiple sites without organ or systems involvement: Secondary | ICD-10-CM | POA: Diagnosis not present

## 2017-06-26 DIAGNOSIS — Z79899 Other long term (current) drug therapy: Secondary | ICD-10-CM

## 2017-06-26 DIAGNOSIS — M0630 Rheumatoid nodule, unspecified site: Secondary | ICD-10-CM | POA: Diagnosis not present

## 2017-06-26 DIAGNOSIS — F17200 Nicotine dependence, unspecified, uncomplicated: Secondary | ICD-10-CM | POA: Diagnosis not present

## 2017-06-26 DIAGNOSIS — Z7952 Long term (current) use of systemic steroids: Secondary | ICD-10-CM | POA: Diagnosis not present

## 2017-06-26 LAB — CBC WITH DIFFERENTIAL/PLATELET
BASOS ABS: 51 {cells}/uL (ref 0–200)
Basophils Relative: 0.7 %
EOS PCT: 2.1 %
Eosinophils Absolute: 153 cells/uL (ref 15–500)
HEMATOCRIT: 46.4 % (ref 38.5–50.0)
Hemoglobin: 16.4 g/dL (ref 13.2–17.1)
LYMPHS ABS: 2343 {cells}/uL (ref 850–3900)
MCH: 33.4 pg — AB (ref 27.0–33.0)
MCHC: 35.3 g/dL (ref 32.0–36.0)
MCV: 94.5 fL (ref 80.0–100.0)
MPV: 9.4 fL (ref 7.5–12.5)
Monocytes Relative: 8.8 %
NEUTROS PCT: 56.3 %
Neutro Abs: 4110 cells/uL (ref 1500–7800)
Platelets: 244 10*3/uL (ref 140–400)
RBC: 4.91 10*6/uL (ref 4.20–5.80)
RDW: 13 % (ref 11.0–15.0)
Total Lymphocyte: 32.1 %
WBC mixed population: 642 cells/uL (ref 200–950)
WBC: 7.3 10*3/uL (ref 3.8–10.8)

## 2017-06-26 LAB — COMPLETE METABOLIC PANEL WITH GFR
AG Ratio: 1.3 (calc) (ref 1.0–2.5)
ALT: 15 U/L (ref 9–46)
AST: 17 U/L (ref 10–35)
Albumin: 4 g/dL (ref 3.6–5.1)
Alkaline phosphatase (APISO): 83 U/L (ref 40–115)
BILIRUBIN TOTAL: 0.5 mg/dL (ref 0.2–1.2)
BUN: 10 mg/dL (ref 7–25)
CO2: 29 mmol/L (ref 20–32)
Calcium: 9.5 mg/dL (ref 8.6–10.3)
Chloride: 105 mmol/L (ref 98–110)
Creat: 1.02 mg/dL (ref 0.70–1.33)
GFR, Est African American: 93 mL/min/{1.73_m2} (ref >=60)
GFR, Est Non African American: 80 mL/min/{1.73_m2} (ref >=60)
GLOBULIN: 3.1 g/dL (ref 1.9–3.7)
Glucose, Bld: 88 mg/dL (ref 65–99)
POTASSIUM: 4.3 mmol/L (ref 3.5–5.3)
SODIUM: 140 mmol/L (ref 135–146)
Total Protein: 7.1 g/dL (ref 6.1–8.1)

## 2017-06-26 NOTE — Patient Instructions (Signed)
Standing Labs We placed an order today for your standing lab work.    Please come back and get your standing labs in June and every 3 months  We have open lab Monday through Friday from 8:30-11:30 AM and 1:30-4 PM at the office of Dr. Tyrica Afzal.   The office is located at [address], [address], [place],  [address] No appointment is necessary.   Labs are drawn by [facility].  You may receive a bill from [facility] for your lab work. If you have any questions regarding directions or hours of operation,  please call [phone].    

## 2017-08-19 ENCOUNTER — Other Ambulatory Visit: Payer: Self-pay | Admitting: Rheumatology

## 2017-08-19 MED FILL — METHOTREXATE 2.5 MG TABLET: 2.5 | 84 days supply | Qty: 120 | Fill #0

## 2017-08-19 NOTE — Telephone Encounter (Signed)
Last Visit: 06/26/17 Next Visit: 09/24/17 Labs: 06/26/17 WNL  Okay to refill per Dr. Corliss Skains

## 2017-09-10 NOTE — Progress Notes (Signed)
Office Visit Note  Patient: Richard Manning             Date of Birth: 08-Jan-1956           MRN: [id number]             PCP: Elfredia Nevins, MD Referring: Elfredia Nevins, MD Visit Date: 09/24/2017 Occupation: @GUAROCC @    Subjective:  Pain in bilateral hands and wrists    History of Present Illness: Richard Manning is a 62 y.o. male with history of seropositive rheumatoid arthritis and osteoarthritis.  He takes MTX 5 tablets by mouth twice weekly, folic acid 2 mg daily, and prednisone 10 mg PRN.  Patient states he feels significantly better since restarting on MTX.  He has not had to take Prednisone for several weeks.  He continues to have pain and swelling in bilateral hands and wrists. He reports his left wrist pain has been waking him up at night.  He denies any new rheumatoid nodules.  He reports his knees are doing well. He continues to have chronic fatigue.   Activities of Daily Living:  Patient reports morning stiffness for 2 hours.   Patient Reports nocturnal pain.  Difficulty dressing/grooming: Denies Difficulty climbing stairs: Denies Difficulty getting out of chair: Denies Difficulty using hands for taps, buttons, cutlery, and/or writing: Denies   Review of Systems  Constitutional: Positive for fatigue. Negative for night sweats.  HENT: Negative for mouth sores, trouble swallowing, trouble swallowing, mouth dryness and nose dryness.   Eyes: Negative for redness, visual disturbance and dryness.  Respiratory: Negative for cough, hemoptysis, shortness of breath and difficulty breathing.   Cardiovascular: Negative for chest pain, palpitations, hypertension, irregular heartbeat and swelling in legs/feet.  Gastrointestinal: Negative for blood in stool, constipation and diarrhea.  Endocrine: Negative for increased urination.  Genitourinary: Negative for painful urination.  Musculoskeletal: Positive for arthralgias, joint pain, joint swelling and morning stiffness.  Negative for myalgias, muscle weakness, muscle tenderness and myalgias.  Skin: Negative for color change, rash, hair loss, nodules/bumps, skin tightness, ulcers and sensitivity to sunlight.  Allergic/Immunologic: Negative for susceptible to infections.  Neurological: Negative for dizziness, fainting, memory loss, night sweats and weakness.  Hematological: Negative for swollen glands.  Psychiatric/Behavioral: Negative for depressed mood and sleep disturbance. The patient is not nervous/anxious.     PMFS History:  There are no active problems to display for this patient.   Past Medical History:  Diagnosis Date  . RA (rheumatoid arthritis) (HCC)     Family History  Problem Relation Age of Onset  . Atrial fibrillation Mother   . Cancer Mother        breast    History reviewed. No pertinent surgical history. Social History   Social History Narrative  . Not on file     Objective: Vital Signs: BP (!) 146/79 (BP Location: Left Arm, Patient Position: Sitting, Cuff Size: Normal)   Pulse 70   Resp 14   Ht 5\' 8"  (1.727 m)   Wt 126 lb (57.2 kg)   BMI 19.16 kg/m    Physical Exam  Constitutional: He is oriented to person, place, and time. He appears well-developed and well-nourished.  HENT:  Head: Normocephalic and atraumatic.  Eyes: Pupils are equal, round, and reactive to light. Conjunctivae and EOM are normal.  Neck: Normal range of motion. Neck supple.  Cardiovascular: Normal rate, regular rhythm and normal heart sounds.  Pulmonary/Chest: Effort normal and breath sounds normal.  Abdominal: Soft. Bowel sounds are normal.  Lymphadenopathy:    He has no cervical adenopathy.  Neurological: He is alert and oriented to person, place, and time.  Skin: Skin is warm and dry. Capillary refill takes less than 2 seconds.  Psychiatric: He has a normal mood and affect. His behavior is normal.  Nursing note and vitals reviewed.    Musculoskeletal Exam: C-spine slightly limited ROM.  Mild  thoracic kyphosis.  No midline spinal tenderness.  No SI joint tenderness.  Shoulder joints, elbow joints, wrist joints, MCPs, PIPs, and DIPs good ROM.  He has synovitis of right 2nd, 3rd, 4th, and 5th MCP and left 2nd, 4th, and 5th MCP joints.  Hip joints, knee joints, ankle joints, MTPs, PIPs, and DIPs good ROM with no synovitis.  No warmth or effusion of knee joints.  No tenderness of trochanteric bursitis.    CDAI Exam: CDAI Homunculus Exam:   Tenderness:  RUE: wrist LUE: wrist  Swelling:  Right hand: 2nd MCP, 3rd MCP, 4th MCP, 5th MCP, 2nd PIP and 3rd PIP Left hand: 2nd MCP, 4th MCP and 5th MCP  Joint Counts:  CDAI Tender Joint count: 2 CDAI Swollen Joint count: 9  Global Assessments:  Patient Global Assessment: 5 Provider Global Assessment: 5  CDAI Calculated Score: 21    Investigation: No additional findings. CBC Latest Ref Rng & Units 06/26/2017 06/11/2017 05/28/2017  WBC 3.8 - 10.8 Thousand/uL 7.3 8.6 8.8  Hemoglobin 13.2 - 17.1 g/dL 78.9 38.1 17.5(H)  Hematocrit 38.5 - 50.0 % 46.4 46.8 49.6  Platelets 140 - 400 Thousand/uL 244 210 247   CMP Latest Ref Rng & Units 06/26/2017 06/11/2017 05/28/2017  Glucose 65 - 99 mg/dL 88 86 -  BUN 7 - 25 mg/dL 10 10 -  Creatinine 0.17 - 1.33 mg/dL 5.10 2.58 -  Sodium 527 - 146 mmol/L 140 141 -  Potassium 3.5 - 5.3 mmol/L 4.3 3.8 -  Chloride 98 - 110 mmol/L 105 107 -  CO2 20 - 32 mmol/L 29 25 -  Calcium 8.6 - 10.3 mg/dL 9.5 9.1 -  Total Protein 6.1 - 8.1 g/dL 7.1 6.7 7.7  Total Bilirubin 0.2 - 1.2 mg/dL 0.5 1.0 -  AST 10 - 35 U/L 17 16 -  ALT 9 - 46 U/L 15 13 -    Imaging: No results found.  Speciality Comments: No specialty comments available.    Procedures:  No procedures performed Allergies: Benadryl [diphenhydramine] and Peanuts [peanut oil]   Assessment / Plan:     Visit Diagnoses: Rheumatoid arthritis involving multiple sites with positive rheumatoid factor (HCC): He continues to have active synovitis of several  MCP and PIP joints.  He has had tenderness in bilateral wrists.  He continues to take methotrexate 5 tablets by mouth on Fridays and 5 tablets on Saturdays.  He takes folic acid 2 mg daily.  He does not need any refills at this time.  He has not taken Prednisone for several weeks.  Since he continues to have synovitis and uncontrolled rheumatoid arthritis we discussed adding on Xeljanz.  He is apprehensive to try subcutaneous injection and would like to try [place].  We discussed the indications, contraindications, and side effects of starting Xeljanz.  We will apply for Xeljanz.  Consent was obtained today.  We also encouraged him to establish care with a PCP and have the Shingrix and pneumococcal vaccine ASAP.  Medication counseling: TB test: 05/28/17 negative  Hepatitis: 05/28/17 negative  Lipid panel: Pending  SPEP: 05/28/17 WNL Immunoglobulins: 05/28/17  Does patient have  history of diverticulitis?  No  Counseled patient that Harriette Ohara is a JAK inhibitor indicated for Rheumatoid Arthritis.  Counseled patient on purpose, proper use, and adverse effects of Xeljanz.   Reviewed the most common adverse effects including infection, diarrhea, headaches.  Also reviewed rare adverse effects such as bowel injury and the need to contact us if he  develops stomach pain during treatment.  Reviewed with patient that there is the possibility of an increased risk of malignancy but it is not well understood if this increased risk is due to the medication or the disease state.  Counseled patient to avoid live vaccines while on [place].  Provided patient with medication education material and answered all questions.  Patient consented to [place].  Will upload Harriette Ohara into patient's chart.    Rheumatoid nodulosis (HCC): He has no new nodules present.   High risk medication use - MTX 5 tablets by mouth on Thurs and 5 tablets on Fridays, folic acid 2 mg daily, Prednisone 10 mg PRN. CBC, CMP, and Lipid panel were ordered  today.  A baseline lipid panel was necessary before starting on Xeljanz.  - Plan: CBC with Differential/Platelet, COMPLETE METABOLIC PANEL WITH GFR, Lipid panel  On prednisone therapy - He has not taken Prednisone in several weeks.    Primary osteoarthritis of right knee: He has no warmth or effusion.  He has no knee discomfort at this time.   Vitamin D deficiency: He is going to start taking a maintenance dose of Vitamin D 2,000 units daily.   Smoker    Orders: Orders Placed This Encounter  Procedures  . CBC with Differential/Platelet  . COMPLETE METABOLIC PANEL WITH GFR  . Lipid panel   No orders of the defined types were placed in this encounter.   Face-to-face time spent with patient was 30 minutes. >50% of time was spent in counseling and coordination of care.  Follow-Up Instructions: Return in about 5 months (around 02/24/2018) for Rheumatoid arthritis, Osteoarthritis.   Gearldine Bienenstock, PA-C  I examined and evaluated the patient with Sherron Ales PA. The plan of care was discussed as noted above.  Pollyann Savoy, MD  Note - This record has been created using Animal nutritionist.  Chart creation errors have been sought, but may not always  have been located. Such creation errors do not reflect on  the standard of medical care.

## 2017-09-23 MED FILL — FOLIC ACID 1 MG TABS: 1 | 90 days supply | Qty: 180 | Fill #1

## 2017-09-24 ENCOUNTER — Ambulatory Visit (INDEPENDENT_AMBULATORY_CARE_PROVIDER_SITE_OTHER): Payer: Medicare Other | Admitting: Physician Assistant

## 2017-09-24 ENCOUNTER — Encounter: Payer: Self-pay | Admitting: Physician Assistant

## 2017-09-24 VITALS — BP 146/79 | HR 70 | Resp 14 | Ht 68.0 in | Wt 126.0 lb

## 2017-09-24 DIAGNOSIS — M0630 Rheumatoid nodule, unspecified site: Secondary | ICD-10-CM | POA: Diagnosis not present

## 2017-09-24 DIAGNOSIS — M0579 Rheumatoid arthritis with rheumatoid factor of multiple sites without organ or systems involvement: Secondary | ICD-10-CM

## 2017-09-24 DIAGNOSIS — E559 Vitamin D deficiency, unspecified: Secondary | ICD-10-CM

## 2017-09-24 DIAGNOSIS — Z7952 Long term (current) use of systemic steroids: Secondary | ICD-10-CM

## 2017-09-24 DIAGNOSIS — F17200 Nicotine dependence, unspecified, uncomplicated: Secondary | ICD-10-CM

## 2017-09-24 DIAGNOSIS — M1711 Unilateral primary osteoarthritis, right knee: Secondary | ICD-10-CM

## 2017-09-24 DIAGNOSIS — Z79899 Other long term (current) drug therapy: Secondary | ICD-10-CM | POA: Diagnosis not present

## 2017-09-24 NOTE — Patient Instructions (Addendum)
Tofacitinib tablets What is this medicine? TOFACITINIB (TOE fa SYE ti nib) is a medicine that works on the immune system. This medicine is used to treat rheumatoid arthritis and psoriatic arthritis. This medicine may be used for other purposes; ask your health care provider or pharmacist if you have questions. COMMON BRAND NAME(S): Harriette Ohara What should I tell my health care provider before I take this medicine? They need to know if you have any of these conditions: -cancer -diabetes -high cholesterol -immune system problems -infection (especially a virus infection such as chickenpox, cold sores, or herpes) -kidney disease -liver disease -low blood counts, like low white cell, platelet, or red cell counts -stomach or intestine problems -an unusual or allergic reaction to tofacitinib, other medicines, foods, dyes, or preservatives -pregnant or trying to get pregnant -breast-feeding How should I use this medicine? Take this medicine by mouth with a glass of water. Follow the directions on the prescription label. You can take it with or without food. If it upsets your stomach, take it with food. A special MedGuide will be given to you by the pharmacist with each prescription and refill. Be sure to read this information carefully each time. Talk to your pediatrician regarding the use of this medicine in children. Special care may be needed. Overdosage: If you think you have taken too much of this medicine contact a poison control center or emergency room at once. NOTE: This medicine is only for you. Do not share this medicine with others. What if I miss a dose? If you miss a dose, take it as soon as you can. If it is almost time for your next dose, take only that dose. Do not take double or extra doses. What may interact with this medicine? Do not take this medicine with any of the following medications: -azathioprine, cyclosporine, or other immunosuppressive drugs -biologic medicines for  arthritis such as abatacept, adalimumab, anakinra, certolizumab, etanercept, golimumab, infliximab, rituximab, secukinumab, tocilizumab, ustekinumab -live vaccines This medicine may also interact with the following medications: -antiviral medicines for hepatitis, HIV or AIDS -certain medicines for fungal infections like fluconazole, itraconazole, ketoconazole, voriconazole -certain medicines for seizures like carbamazepine, phenobarbital, phenytoin -rifampin -supplements, such as St. John's wort This list may not describe all possible interactions. Give your health care provider a list of all the medicines, herbs, non-prescription drugs, or dietary supplements you use. Also tell them if you smoke, drink alcohol, or use illegal drugs. Some items may interact with your medicine. What should I watch for while using this medicine? Tell your doctor or healthcare professional if your symptoms do not start to get better or if they get worse. Avoid taking products that contain aspirin, acetaminophen, ibuprofen, naproxen, or ketoprofen unless instructed by your doctor. These medicines may hide a fever. Call your doctor or health care professional for advice if you get a fever, chills or sore throat, or other symptoms of a cold or flu. Do not treat yourself. This drug decreases your body's ability to fight infections. Try to avoid being around people who are sick. What side effects may I notice from receiving this medicine? Side effects that you should report to your doctor or health care professional as soon as possible: -allergic reactions like skin rash, itching or hives, swelling of the face, lips, or tongue -breathing problems -dizziness -signs of infection - fever or chills, cough, sore throat, pain or trouble passing urine -signs and symptoms of liver injury like dark yellow or brown urine; general ill feeling or  flu-like symptoms; light-colored stools; loss of appetite; nausea; right upper belly  pain; unusually weak or tired; yellowing of the eyes or skin -stomach pain or a sudden change in bowel habits -unusually weak or tired Side effects that usually do not require medical attention (report to your doctor or health care professional if they continue or are bothersome): -diarrhea -headache -muscle aches -runny nose -sinus trouble This list may not describe all possible side effects. Call your doctor for medical advice about side effects. You may report side effects to FDA at 1-800-FDA-1088. Where should I keep my medicine? Keep out of the reach of children. Store between 20 and 25 degrees C (68 and 77 degrees F). Throw away any unused medicine after the expiration date. NOTE: This sheet is a summary. It may not cover all possible information. If you have questions about this medicine, talk to your doctor, pharmacist, or health care provider.  2018 Elsevier/Gold Standard (2016-05-03 11:43:53) Please call [facility] to establish care    Standing Labs We placed an order today for your standing lab work.    Please come back and get your standing labs in August and every 3 months   We have open lab Monday through Friday from 8:30-11:30 AM and 1:30-4:00 PM  at the office of Dr. Pollyann Savoy.   You may experience shorter wait times on Monday and Friday afternoons. The office is located at [address], [place], [place] [address] No appointment is necessary.   Labs are drawn by [facility].  You may receive a bill from [facility] for your lab work. If you have any questions regarding directions or hours of operation,  please call [phone].

## 2017-09-25 ENCOUNTER — Telehealth: Payer: Self-pay

## 2017-09-25 LAB — COMPLETE METABOLIC PANEL WITH GFR
AG RATIO: 1.4 (calc) (ref 1.0–2.5)
ALT: 18 U/L (ref 9–46)
AST: 22 U/L (ref 10–35)
Albumin: 4.1 g/dL (ref 3.6–5.1)
Alkaline phosphatase (APISO): 95 U/L (ref 40–115)
BUN: 9 mg/dL (ref 7–25)
CO2: 29 mmol/L (ref 20–32)
Calcium: 9.2 mg/dL (ref 8.6–10.3)
Chloride: 103 mmol/L (ref 98–110)
Creat: 0.87 mg/dL (ref 0.70–1.33)
GFR, EST AFRICAN AMERICAN: 109 mL/min/{1.73_m2} (ref >=60)
GFR, EST NON AFRICAN AMERICAN: 94 mL/min/{1.73_m2} (ref >=60)
GLOBULIN: 3 g/dL (ref 1.9–3.7)
Glucose, Bld: 83 mg/dL (ref 65–99)
Potassium: 4.3 mmol/L (ref 3.5–5.3)
SODIUM: 138 mmol/L (ref 135–146)
TOTAL PROTEIN: 7.1 g/dL (ref 6.1–8.1)
Total Bilirubin: 0.7 mg/dL (ref 0.2–1.2)

## 2017-09-25 LAB — CBC WITH DIFFERENTIAL/PLATELET
BASOS PCT: 0.6 %
Basophils Absolute: 43 cells/uL (ref 0–200)
EOS PCT: 1 %
Eosinophils Absolute: 71 cells/uL (ref 15–500)
HEMATOCRIT: 44.8 % (ref 38.5–50.0)
HEMOGLOBIN: 15.6 g/dL (ref 13.2–17.1)
LYMPHS ABS: 1938 {cells}/uL (ref 850–3900)
MCH: 34.4 pg — ABNORMAL HIGH (ref 27.0–33.0)
MCHC: 34.8 g/dL (ref 32.0–36.0)
MCV: 98.9 fL (ref 80.0–100.0)
MONOS PCT: 6.6 %
MPV: 9.6 fL (ref 7.5–12.5)
NEUTROS ABS: 4580 {cells}/uL (ref 1500–7800)
Neutrophils Relative %: 64.5 %
Platelets: 255 10*3/uL (ref 140–400)
RBC: 4.53 10*6/uL (ref 4.20–5.80)
RDW: 13 % (ref 11.0–15.0)
Total Lymphocyte: 27.3 %
WBC mixed population: 469 cells/uL (ref 200–950)
WBC: 7.1 10*3/uL (ref 3.8–10.8)

## 2017-09-25 LAB — LIPID PANEL
Cholesterol: 142 mg/dL (ref <200)
HDL: 50 mg/dL (ref >40)
LDL Cholesterol (Calc): 76 mg/dL (calc)
NON-HDL CHOLESTEROL (CALC): 92 mg/dL (ref <130)
Total CHOL/HDL Ratio: 2.8 (calc) (ref <5.0)
Triglycerides: 76 mg/dL (ref <150)

## 2017-09-25 NOTE — Telephone Encounter (Signed)
Authorization has been submitted to pts insurance via cover my meds. Will update once we have a response.   Kyrin Gratz, [place], CPhT 11:33 AM

## 2017-09-25 NOTE — Progress Notes (Signed)
CBC stable. CMP WNL.  Lipid panel WNL.

## 2017-09-25 NOTE — Telephone Encounter (Signed)
-----   Message from Ellen Henri, CMA sent at 09/24/2017  4:35 PM EDT ----- Regarding: Harriette Ohara Please apply for xeljanz 11mg , per . Thanks!

## 2017-09-26 NOTE — Progress Notes (Signed)
Left message to call back for lab results.

## 2017-09-29 ENCOUNTER — Telehealth: Payer: Self-pay | Admitting: Rheumatology

## 2017-09-29 NOTE — Telephone Encounter (Signed)
Patient left a voicemail stating he was returning your call. °

## 2017-09-29 NOTE — Telephone Encounter (Signed)
Prior authorization denied, plan prefers [employer]. Would you like to submit an appeal or change the medication?  Patient is apprehensive to try subcutaneous therapy.

## 2017-09-29 NOTE — Telephone Encounter (Signed)
Please try submitting an appeal due to patients needle phobia.

## 2017-09-30 NOTE — Telephone Encounter (Signed)
He will need G6PD prior to starting on sulfasalazine.  Please a schedule an appointment to discuss the medication and obtain informed consent.

## 2017-09-30 NOTE — Telephone Encounter (Signed)
When on the phone reviewing labs with patient he states he would like to try to SSZ again instead on [place]. Patient would like to know if that is an option. Patient wants to advise he has made an appointment with a PCP Dr. Nita Sells and his appointment is scheduled for 10/09/17.

## 2017-09-30 NOTE — Telephone Encounter (Signed)
Reviewed lab results with patient. Patient verbalized understanding. 

## 2017-09-30 NOTE — Telephone Encounter (Signed)
Patient scheduled for 10/01/17 at 10:20 am.

## 2017-10-01 ENCOUNTER — Ambulatory Visit (INDEPENDENT_AMBULATORY_CARE_PROVIDER_SITE_OTHER): Payer: Medicare Other | Admitting: Physician Assistant

## 2017-10-01 ENCOUNTER — Encounter: Payer: Self-pay | Admitting: Physician Assistant

## 2017-10-01 VITALS — BP 145/96 | HR 73 | Resp 13 | Ht 68.0 in | Wt 124.0 lb

## 2017-10-01 DIAGNOSIS — E559 Vitamin D deficiency, unspecified: Secondary | ICD-10-CM | POA: Diagnosis not present

## 2017-10-01 DIAGNOSIS — Z79899 Other long term (current) drug therapy: Secondary | ICD-10-CM | POA: Diagnosis not present

## 2017-10-01 DIAGNOSIS — M0579 Rheumatoid arthritis with rheumatoid factor of multiple sites without organ or systems involvement: Secondary | ICD-10-CM

## 2017-10-01 DIAGNOSIS — F17200 Nicotine dependence, unspecified, uncomplicated: Secondary | ICD-10-CM

## 2017-10-01 DIAGNOSIS — M1711 Unilateral primary osteoarthritis, right knee: Secondary | ICD-10-CM

## 2017-10-01 DIAGNOSIS — M0630 Rheumatoid nodule, unspecified site: Secondary | ICD-10-CM

## 2017-10-01 NOTE — Progress Notes (Signed)
Office Visit Note  Patient: Richard Manning             Date of Birth: February 09, 1956           MRN: [id number]             PCP: Elfredia Nevins, MD Referring: Elfredia Nevins, MD Visit Date: 10/01/2017 Occupation: @GUAROCC @    Subjective:  Joint pain   History of Present Illness: Richard Manning is a 62 y.o. male with history of seropositive rheumatoid arthritis and osteoarthritis.  Patient continues to take methotrexate 5 tablets on Friday and 5 tablets on Saturdays.  He would like to discuss sulfasalazine.  He is nervous about the side effects of 07-25-1990 and would like to move forward subfalcine.  He states he was briefly on sulfasalazine when he has been treated by Dr. Harriette Ohara, but he was not compliant at that time.  He denies any joint pain or joint swelling at this time.  He states he has very little morning stiffness lasting about 2 minutes.  He states he is no longer taking prednisone.  He denies any pain in his right knee or swelling at this time.  He states that he did establish with a PCP Dr. Kellie Simmering in [place] and has his first appointment on October 09, 2017 to establish care.  He states that he is also considering quitting smoking.   Activities of Daily Living:  Patient reports morning stiffness for 2  minutes.   Patient Denies nocturnal pain.  Difficulty dressing/grooming: Denies Difficulty climbing stairs: Denies Difficulty getting out of chair: Denies Difficulty using hands for taps, buttons, cutlery, and/or writing: Denies   Review of Systems  Constitutional: Positive for fatigue. Negative for night sweats.  HENT: Negative for mouth sores, trouble swallowing, trouble swallowing, mouth dryness and nose dryness.   Eyes: Negative for redness, visual disturbance and dryness.  Respiratory: Negative for cough, hemoptysis, shortness of breath and difficulty breathing.   Cardiovascular: Negative for chest pain, palpitations, hypertension, irregular heartbeat and swelling in  legs/feet.  Gastrointestinal: Negative for blood in stool, constipation and diarrhea.  Endocrine: Negative for increased urination.  Genitourinary: Negative for painful urination.  Musculoskeletal: Positive for morning stiffness. Negative for arthralgias, joint pain, joint swelling, myalgias, muscle weakness, muscle tenderness and myalgias.  Skin: Negative for color change, rash, hair loss, nodules/bumps, skin tightness, ulcers and sensitivity to sunlight.  Allergic/Immunologic: Negative for susceptible to infections.  Neurological: Negative for dizziness, fainting, memory loss, night sweats and weakness.  Hematological: Negative for swollen glands.  Psychiatric/Behavioral: Negative for depressed mood and sleep disturbance. The patient is not nervous/anxious.     PMFS History:  There are no active problems to display for this patient.   Past Medical History:  Diagnosis Date  . RA (rheumatoid arthritis) (HCC)     Family History  Problem Relation Age of Onset  . Atrial fibrillation Mother   . Cancer Mother        breast    History reviewed. No pertinent surgical history. Social History   Social History Narrative  . Not on file     Objective: Vital Signs: BP (!) 145/96 (BP Location: Left Arm, Patient Position: Sitting, Cuff Size: Normal)   Pulse 73   Resp 13   Ht 5\' 8"  (1.727 m)   Wt 124 lb (56.2 kg)   BMI 18.85 kg/m    Physical Exam  Constitutional: He is oriented to person, place, and time. He appears well-developed and well-nourished.  HENT:  Head: Normocephalic and atraumatic.  Eyes: Pupils are equal, round, and reactive to light. Conjunctivae and EOM are normal.  Neck: Normal range of motion. Neck supple.  Cardiovascular: Normal rate, regular rhythm and normal heart sounds.  Pulmonary/Chest: Effort normal and breath sounds normal.  Abdominal: Soft. Bowel sounds are normal.  Lymphadenopathy:    He has no cervical adenopathy.  Neurological: He is alert and  oriented to person, place, and time.  Skin: Skin is warm and dry. Capillary refill takes less than 2 seconds.  Psychiatric: He has a normal mood and affect. His behavior is normal.  Nursing note and vitals reviewed.    Musculoskeletal Exam: C-spine slightly limited ROM.  thoracic spine and lumbar spine good range of motion.  No midline spinal tenderness.  No SI joint tenderness.  Shoulder joints, elbow joints, wrist joints, MCPs, PIPs, DIPs good range of motion.  Synovitis of right second third and fourth and left second fourth fifth MCP joints..  He has synovial thickening of MCP joints.  Hip joints, knee joints, ankle joints, MTPs, PIPs, DIPs good range of motion with no synovitis.  No warmth or effusion of bilateral knee joints.  CDAI Exam: CDAI Homunculus Exam:   Joint Counts:  CDAI Tender Joint count: 0 CDAI Swollen Joint count: 0  Global Assessments:  Patient Global Assessment: 4 Provider Global Assessment: 4  CDAI Calculated Score: 8    Investigation: No additional findings. CBC Latest Ref Rng & Units 09/24/2017 06/26/2017 06/11/2017  WBC 3.8 - 10.8 Thousand/uL 7.1 7.3 8.6  Hemoglobin 13.2 - 17.1 g/dL 20.1 00.7 12.1  Hematocrit 38.5 - 50.0 % 44.8 46.4 46.8  Platelets 140 - 400 Thousand/uL 255 244 210   CMP Latest Ref Rng & Units 09/24/2017 06/26/2017 06/11/2017  Glucose 65 - 99 mg/dL 83 88 86  BUN 7 - 25 mg/dL 9 10 10   Creatinine 0.70 - 1.33 mg/dL 9.75 8.83 2.54  Sodium 135 - 146 mmol/L 138 140 141  Potassium 3.5 - 5.3 mmol/L 4.3 4.3 3.8  Chloride 98 - 110 mmol/L 103 105 107  CO2 20 - 32 mmol/L 29 29 25   Calcium 8.6 - 10.3 mg/dL 9.2 9.5 9.1  Total Protein 6.1 - 8.1 g/dL 7.1 7.1 6.7  Total Bilirubin 0.2 - 1.2 mg/dL 0.7 0.5 1.0  AST 10 - 35 U/L 22 17 16   ALT 9 - 46 U/L 18 15 13      Imaging: No results found.  Speciality Comments: No specialty comments available.    Procedures:  No procedures performed Allergies: Benadryl [diphenhydramine] and Peanuts [peanut  oil]   Assessment / Plan:     Visit Diagnoses: Rheumatoid arthritis involving multiple sites with positive rheumatoid factor (HCC): He continues to have the synovitis in his MCP joints.  He has mild tenderness of bilateral wrists.  He will continue taking methotrexate 5 tablets on Fridays and 5 tablets on Saturday.  He also take folic acid 2 mg daily.  He is no longer taking prednisone.  We discussed starting him on sulfasalazine 500 mg 2 tablets in the morning and 2 tablets in the evening.  Indications, contraindications, and side effects were discussed.  He was previously on sulfasalazine when he was being treated by Dr. Kellie Simmering and he tolerated it well.  He was hesitant to start on Xeljanz.  He is still apprehensive to start on a subcutaneous injection.  Consent was obtained today and all questions were addressed.  We will send in a prescription for sulfasalazine when his G6PD  lab results comes back.  Association of heart disease with rheumatoid arthritis was discussed. Need to monitor blood pressure, cholesterol, and to exercise 30-60 minutes on daily basis was discussed. Poor dental hygiene can be a predisposing factor for rheumatoid arthritis. Good dental hygiene was discussed.  He has a PCP appointment on October 09, 2017 to establish care.  We discussed the importance of tobacco cessation and also discussing possibly starting Chantix.  Rheumatoid nodulosis (HCC): No new nodules present.  High risk medication use - MTX 5 tablets on Fridays and 5 tablets on Saturdays.  G6PD was ordered today.  Once results are back we will start him on sulfasalazine 500 mg 2 tablets in the morning and 2 tablets in the evening.- Plan: Glucose 6 phosphate dehydrogenase  Primary osteoarthritis of right knee: No warmth or effusion of knee joints. He has good ROM with no discomfort.   Smoker: We discussed the importance of tobacco cessation.  He is planning on trying to quit soon.  He is establishing care with his PCP  on October 09, 2017 and we discussed possibly discussing starting on Chantix or other tobacco cessation products.  Vitamin D deficiency  Orders: Orders Placed This Encounter  Procedures  . Glucose 6 phosphate dehydrogenase   No orders of the defined types were placed in this encounter.   Face-to-face time spent with patient was 30 minutes. >50% of time was spent in counseling and coordination of care.  Follow-Up Instructions: Return in about 3 months (around 01/01/2018) for Rheumatoid arthritis, Osteoarthritis.   Gearldine Bienenstock, PA-C .me Note - This record has been created using Dragon software.  Chart creation errors have been sought, but may not always  have been located. Such creation errors do not reflect on  the standard of medical care.

## 2017-10-01 NOTE — Patient Instructions (Signed)
Sulfasalazine tablets What is this medicine? SULFASALAZINE (sul fa SAL a zeen) is used to treat ulcerative colitis. This medicine may be used for other purposes; ask your health care provider or pharmacist if you have questions. COMMON BRAND NAME(S): Azulfidine, Sulfazine What should I tell my health care provider before I take this medicine? They need to know if you have any of these conditions: -asthma -blood disorders or anemia -glucose-6-phosphate dehydrogenase (G6PD) deficiency -intestinal obstruction -kidney disease -liver disease -porphyria -urinary tract obstruction -an unusual reaction to sulfasalazine, sulfa drugs, salicylates, or other medicines, foods, dyes, or preservatives -pregnant or trying to get pregnant -breast-feeding How should I use this medicine? Take this medicine by mouth with a full glass of water. Follow the directions on the prescription label. If the medicine upsets your stomach, take it with food or milk. Take your medicine at regular intervals. Do not take your medicine more often than directed. Do not stop taking except on your doctor's advice. Talk to your pediatrician regarding the use of this medicine in children. While this drug may be prescribed for children as young as 6 years for selected conditions, precautions do apply. Patients over 77 years old may have a stronger reaction and need a smaller dose. Overdosage: If you think you have taken too much of this medicine contact a poison control center or emergency room at once. NOTE: This medicine is only for you. Do not share this medicine with others. What if I miss a dose? If you miss a dose, take it as soon as you can. If it is almost time for your next dose, take only that dose. Do not take double or extra doses. What may interact with this medicine? -digoxin -folic acid This list may not describe all possible interactions. Give your health care provider a list of all the medicines, herbs,  non-prescription drugs, or dietary supplements you use. Also tell them if you smoke, drink alcohol, or use illegal drugs. Some items may interact with your medicine. What should I watch for while using this medicine? Visit your doctor or health care professional for regular checks on your progress. You will need frequent blood and urine checks. This medicine can make you more sensitive to the sun. Keep out of the sun. If you cannot avoid being in the sun, wear protective clothing and use sunscreen. Do not use sun lamps or tanning beds/booths. Drink plenty of water while taking this medicine. What side effects may I notice from receiving this medicine? Side effects that you should report to your doctor or health care professional as soon as possible: -allergic reactions like skin rash, itching or hives, swelling of the face, lips, or tongue -fever, chills, or any other sign of infection -painful, difficult, or reduced urination -redness, blistering, peeling or loosening of the skin, including inside the mouth -severe stomach pain -unusual bleeding or bruising -unusually weak or tired -yellowing of the skin or eyes Side effects that usually do not require medical attention (report to your doctor or health care professional if they continue or are bothersome): -headache -loss of appetite -nausea, vomiting -orange color to the urine -reduced sperm count This list may not describe all possible side effects. Call your doctor for medical advice about side effects. You may report side effects to FDA at 1-800-FDA-1088. Where should I keep my medicine? Keep out of the reach of children. Store at room temperature between 15 and 30 degrees C (59 and 86 degrees F). Keep container tightly closed. Throw away  any unused medicine after the expiration date. NOTE: This sheet is a summary. It may not cover all possible information. If you have questions about this medicine, talk to your doctor, pharmacist, or  health care provider.  2018 Elsevier/Gold Standard (2007-12-16 11:38:15)  

## 2017-10-02 LAB — GLUCOSE 6 PHOSPHATE DEHYDROGENASE: G-6PDH: 17.5 U/g{Hb} (ref 7.0–20.5)

## 2017-10-02 NOTE — Progress Notes (Signed)
G6PD negative.  Ok to send in SSZ.

## 2017-10-06 ENCOUNTER — Telehealth: Payer: Self-pay | Admitting: *Deleted

## 2017-10-06 ENCOUNTER — Telehealth: Payer: Self-pay | Admitting: Rheumatology

## 2017-10-06 MED ORDER — SULFASALAZINE 500 MG PO TABS: 1000.0000 mg | ORAL_TABLET | Freq: Two times a day (BID) | ORAL | 2 refills | Status: DC

## 2017-10-06 MED FILL — sulfaSALAzine 500 MG TABS: 500 | 30 days supply | Qty: 120 | Fill #0

## 2017-10-06 NOTE — Telephone Encounter (Signed)
-----   Message from Gearldine Bienenstock, PA-C sent at 10/02/2017  5:16 PM EDT ----- G6PD negative.  Ok to send in SSZ.

## 2017-10-06 NOTE — Telephone Encounter (Signed)
Prescription has been sent to the pharmacy. 

## 2017-10-06 NOTE — Telephone Encounter (Signed)
Patient left a voicemail stating he is at [facility] and they don't have the prescription of SSZ.

## 2017-10-09 DIAGNOSIS — Z0189 Encounter for other specified special examinations: Secondary | ICD-10-CM | POA: Diagnosis not present

## 2017-10-09 DIAGNOSIS — Z681 Body mass index (BMI) 19 or less, adult: Secondary | ICD-10-CM | POA: Diagnosis not present

## 2017-10-09 DIAGNOSIS — M06049 Rheumatoid arthritis without rheumatoid factor, unspecified hand: Secondary | ICD-10-CM | POA: Diagnosis not present

## 2017-10-21 DIAGNOSIS — E782 Mixed hyperlipidemia: Secondary | ICD-10-CM | POA: Diagnosis not present

## 2017-10-21 DIAGNOSIS — Z125 Encounter for screening for malignant neoplasm of prostate: Secondary | ICD-10-CM | POA: Diagnosis not present

## 2017-11-13 ENCOUNTER — Other Ambulatory Visit: Payer: Self-pay | Admitting: Rheumatology

## 2017-11-13 MED FILL — METHOTREXATE 2.5 MG TABLET: 2.5 | 84 days supply | Qty: 120 | Fill #0

## 2017-11-13 NOTE — Telephone Encounter (Signed)
Last visit: 10/01/17 Next visit: 01/01/18 Labs: 09/24/17 CBC stable. CMP WNL  Okay to refill per Dr. Corliss Skains

## 2017-11-27 MED FILL — predniSONE 10 MG TABS: 10 | 30 days supply | Qty: 30 | Fill #1

## 2017-12-05 DIAGNOSIS — Z681 Body mass index (BMI) 19 or less, adult: Secondary | ICD-10-CM | POA: Diagnosis not present

## 2017-12-05 DIAGNOSIS — M06049 Rheumatoid arthritis without rheumatoid factor, unspecified hand: Secondary | ICD-10-CM | POA: Diagnosis not present

## 2017-12-05 DIAGNOSIS — Z0000 Encounter for general adult medical examination without abnormal findings: Secondary | ICD-10-CM | POA: Diagnosis not present

## 2017-12-05 DIAGNOSIS — Z720 Tobacco use: Secondary | ICD-10-CM | POA: Diagnosis not present

## 2017-12-05 DIAGNOSIS — K4090 Unilateral inguinal hernia, without obstruction or gangrene, not specified as recurrent: Secondary | ICD-10-CM | POA: Diagnosis not present

## 2017-12-18 NOTE — Progress Notes (Signed)
Office Visit Note  Patient: Richard Manning             Date of Birth: June 04, 1955           MRN: [id number]             PCP: Benita Stabile, MD Referring: Elfredia Nevins, MD Visit Date: 01/01/2018 Occupation: @GUAROCC @  Subjective:  Right hand pain and swelling   History of Present Illness: Richard Manning is a 62 y.o. male history of seropositive rheumatoid arthritis and osteoarthritis.  Patient is on methotrexate 5 tablets by mouth on Fridays and 5 tablets by mouth on Saturdays.  He took sulfasalazine for 1 week but could not tolerate taking it due to experiencing indigestion.  He reports his last dose of prednisone was about 2 months ago.  He states he has been doing very well during the summertime.  He states that when he does have a flare he will take 1-2 doses of prednisone 10 mg by mouth.  He takes tylenol and advil PRN for pain relief if the pain is severe.  He states that for the past few days he has been having pain and swelling in his right third MCP joint.  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  He denies any other joint pain or joint swelling at this time.  He denies any nausea alkalosis.  He denies any right knee pain at this time.  He states that he established with a PCP Dr. Margo Aye and had a complete physical recently.  Activities of Daily Living:  Patient reports morning stiffness forhours.   Patient Denies nocturnal pain.  Difficulty dressing/grooming: Denies Difficulty climbing stairs: Denies Difficulty getting out of chair: Reports Difficulty using hands for taps, buttons, cutlery, and/or writing: Denies  Review of Systems  Constitutional: Positive for fatigue. Negative for night sweats.  HENT: Negative for mouth sores, ear ringing, mouth dryness and nose dryness.   Eyes: Negative for redness, visual disturbance and dryness.  Respiratory: Negative for cough, hemoptysis, shortness of breath, apnea and  difficulty breathing.   Cardiovascular: Negative for chest pain, palpitations, hypertension, irregular heartbeat and swelling in legs/feet.  Gastrointestinal: Negative for blood in stool, constipation and diarrhea.  Endocrine: Negative for increased urination.  Genitourinary: Negative for difficulty urinating, painful urination and urgency.  Musculoskeletal: Positive for arthralgias, joint pain, joint swelling and morning stiffness. Negative for myalgias, muscle weakness, muscle tenderness and myalgias.  Skin: Negative for color change, rash, hair loss, nodules/bumps, skin tightness, ulcers and sensitivity to sunlight.  Neurological: Negative for dizziness, fainting, headaches, memory loss, night sweats and weakness.  Hematological: Negative for swollen glands.  Psychiatric/Behavioral: Negative for depressed mood and sleep disturbance. The patient is not nervous/anxious.     PMFS History:  There are no active problems to display for this patient.   Past Medical History:  Diagnosis Date  . RA (rheumatoid arthritis) (HCC)     Family History  Problem Relation Age of Onset  . Atrial fibrillation Mother   . Cancer Mother        breast    History reviewed. No pertinent surgical history. Social History   Social History Narrative  . Not on file    Objective: Vital Signs: BP 112/73 (BP Location: Left Arm, Patient Position: Sitting, Cuff Size: Normal)   Pulse (!) 58   Ht 5\' 8"  (1.727 m)   Wt 126 lb 12.8 oz (57.5 kg)  BMI 19.28 kg/m    Physical Exam  Constitutional: He is oriented to person, place, and time. He appears well-developed and well-nourished.  HENT:  Head: Normocephalic and atraumatic.  Eyes: Pupils are equal, round, and reactive to light. Conjunctivae and EOM are normal.  Neck: Normal range of motion. Neck supple.  Cardiovascular: Normal rate, regular rhythm and normal heart sounds.  Pulmonary/Chest: Effort normal and breath sounds normal.  Abdominal: Soft. Bowel  sounds are normal.  Lymphadenopathy:    He has no cervical adenopathy.  Neurological: He is alert and oriented to person, place, and time.  Skin: Skin is warm and dry. Capillary refill takes less than 2 seconds.  Psychiatric: He has a normal mood and affect. His behavior is normal.  Nursing note and vitals reviewed.    Musculoskeletal Exam: C-spine, thoracic spine, lumbar spine good range of motion.  No midline spinal tenderness.  No SI joint tenderness.  Shoulder joints, elbow joints, wrist joints, MCPs, PIPs, DIPs good range of motion.  He has complete fist formation bilaterally.  He has synovial thickening of all MCP joints.  He has synovitis and tenderness of right second and third MCP joints.  He has swelling but no tenderness of left fourth and fifth MCP joints.  Hip joints, knee joints, ankle joints good range of motion with no discomfort or synovitis.  He has not warmth or effusion of knee joints.  No tenderness of trochanteric bursa bilaterally.   CDAI Exam: CDAI Score: 8.8  Patient Global Assessment: 4 (mm); Provider Global Assessment: 4 (mm) Swollen: 4 ; Tender: 4  Joint Exam      Right  Left  MCP 2  Swollen Tender  Swollen Tender  MCP 3  Swollen Tender     MCP 5     Swollen Tender     Investigation: No additional findings.  Imaging: No results found.  Recent Labs: Lab Results  Component Value Date   WBC 7.1 09/24/2017   HGB 15.6 09/24/2017   PLT 255 09/24/2017   NA 138 09/24/2017   K 4.3 09/24/2017   CL 103 09/24/2017   CO2 29 09/24/2017   GLUCOSE 83 09/24/2017   BUN 9 09/24/2017   CREATININE 0.87 09/24/2017   BILITOT 0.7 09/24/2017   AST 22 09/24/2017   ALT 18 09/24/2017   PROT 7.1 09/24/2017   CALCIUM 9.2 09/24/2017   GFRAA 109 09/24/2017   QFTBGOLDPLUS NEGATIVE 05/28/2017    Speciality Comments: No specialty comments available.  Procedures:  No procedures performed Allergies: Benadryl [diphenhydramine] and Peanuts [peanut oil]   Assessment /  Plan:     Visit Diagnoses: Rheumatoid arthritis involving multiple sites with positive rheumatoid factor (HCC): He continues to have active synovitis of right second and third MCP joints.  He reports he has been doing well this summer and has not been experiencing frequent flares.  He states that a couple days ago he developed increased pain and swelling in his right third MCP joint.  He has been taking methotrexate 5 tablets by mouth on Fridays and 5 tablets by mouth on Saturdays and he continues to take folic acid 2 mg daily.  He tried taking sulfasalazine for 1 week but discontinued due to GI intolerance.  He has not taken prednisone in 2 months.  He is hesitant to start on any new medications at this time.  He will continue taking methotrexate 5 tablets by mouth on Fridays and 5 tablets by mouth on Saturdays.  He does not need any  refills at this time.  He will follow-up in 5 months.  He was advised to notify us if he develops increased joint pain or joint swelling.  Rheumatoid nodulosis (HCC): No new nodules present.   High risk medication use - MTX 5 tablets on Fridays and 5 tablets on Saturdays, d/c SSZ 500 mg 2 tablets BID due to GI intolerance.  CBC and CMP will be drawn today to monitor for drug toxicity.  He will return in December and every 3 months for lab work.- Plan: CBC with Differential/Platelet, COMPLETE METABOLIC PANEL WITH GFR  Primary osteoarthritis of right knee: No warmth or effusion.  He has good range of motion with no discomfort at this time.  On prednisone therapy -He has not taken prednisone in 2 months.  If he is having a severe flare he will take prednisone 10 mg for 2 to 3 days.  He does not need a refill of prednisone at this time.  Vitamin D deficiency  Smoker   Orders: Orders Placed This Encounter  Procedures  . CBC with Differential/Platelet  . COMPLETE METABOLIC PANEL WITH GFR   No orders of the defined types were placed in this encounter.    Follow-Up  Instructions: Return in about 5 months (around 06/03/2018) for Rheumatoid arthritis, Osteoarthritis.    I examined and evaluated the patient with Sherron Ales PA.  Patient continues to have synovitis in his joints on my exam.  We had detailed discussion regarding getting Biologics but at this point he wants to hold off.  He states he has not had much discomfort.  The plan of care was discussed as noted above.  Pollyann Savoy, MD    Note - This record has been created using Animal nutritionist.  Chart creation errors have been sought, but may not always  have been located. Such creation errors do not reflect on  the standard of medical care.

## 2018-01-01 ENCOUNTER — Encounter: Payer: Self-pay | Admitting: Physician Assistant

## 2018-01-01 ENCOUNTER — Ambulatory Visit (INDEPENDENT_AMBULATORY_CARE_PROVIDER_SITE_OTHER): Payer: Medicare Other | Admitting: Physician Assistant

## 2018-01-01 VITALS — BP 112/73 | HR 58 | Ht 68.0 in | Wt 126.8 lb

## 2018-01-01 DIAGNOSIS — F17200 Nicotine dependence, unspecified, uncomplicated: Secondary | ICD-10-CM

## 2018-01-01 DIAGNOSIS — M0579 Rheumatoid arthritis with rheumatoid factor of multiple sites without organ or systems involvement: Secondary | ICD-10-CM

## 2018-01-01 DIAGNOSIS — Z79899 Other long term (current) drug therapy: Secondary | ICD-10-CM

## 2018-01-01 DIAGNOSIS — E559 Vitamin D deficiency, unspecified: Secondary | ICD-10-CM | POA: Diagnosis not present

## 2018-01-01 DIAGNOSIS — Z7952 Long term (current) use of systemic steroids: Secondary | ICD-10-CM | POA: Diagnosis not present

## 2018-01-01 DIAGNOSIS — M1711 Unilateral primary osteoarthritis, right knee: Secondary | ICD-10-CM | POA: Diagnosis not present

## 2018-01-01 DIAGNOSIS — M0630 Rheumatoid nodule, unspecified site: Secondary | ICD-10-CM

## 2018-01-02 LAB — COMPLETE METABOLIC PANEL WITH GFR
AG Ratio: 1.3 (calc) (ref 1.0–2.5)
ALBUMIN MSPROF: 3.8 g/dL (ref 3.6–5.1)
ALKALINE PHOSPHATASE (APISO): 83 U/L (ref 40–115)
ALT: 12 U/L (ref 9–46)
AST: 18 U/L (ref 10–35)
BILIRUBIN TOTAL: 0.6 mg/dL (ref 0.2–1.2)
BUN: 10 mg/dL (ref 7–25)
CHLORIDE: 106 mmol/L (ref 98–110)
CO2: 25 mmol/L (ref 20–32)
Calcium: 9.1 mg/dL (ref 8.6–10.3)
Creat: 0.91 mg/dL (ref 0.70–1.25)
GFR, EST AFRICAN AMERICAN: 106 mL/min/{1.73_m2} (ref >=60)
GFR, Est Non African American: 91 mL/min/{1.73_m2} (ref >=60)
GLUCOSE: 91 mg/dL (ref 65–99)
Globulin: 2.9 g/dL (calc) (ref 1.9–3.7)
Potassium: 4 mmol/L (ref 3.5–5.3)
Sodium: 139 mmol/L (ref 135–146)
TOTAL PROTEIN: 6.7 g/dL (ref 6.1–8.1)

## 2018-01-02 LAB — CBC WITH DIFFERENTIAL/PLATELET
BASOS ABS: 62 {cells}/uL (ref 0–200)
Basophils Relative: 0.8 %
EOS ABS: 100 {cells}/uL (ref 15–500)
Eosinophils Relative: 1.3 %
HCT: 44.3 % (ref 38.5–50.0)
Hemoglobin: 15.1 g/dL (ref 13.2–17.1)
Lymphs Abs: 1686 cells/uL (ref 850–3900)
MCH: 34.5 pg — AB (ref 27.0–33.0)
MCHC: 34.1 g/dL (ref 32.0–36.0)
MCV: 101.1 fL — ABNORMAL HIGH (ref 80.0–100.0)
MONOS PCT: 7.3 %
MPV: 9.5 fL (ref 7.5–12.5)
Neutro Abs: 5290 cells/uL (ref 1500–7800)
Neutrophils Relative %: 68.7 %
PLATELETS: 236 10*3/uL (ref 140–400)
RBC: 4.38 10*6/uL (ref 4.20–5.80)
RDW: 13.7 % (ref 11.0–15.0)
TOTAL LYMPHOCYTE: 21.9 %
WBC mixed population: 562 cells/uL (ref 200–950)
WBC: 7.7 10*3/uL (ref 3.8–10.8)

## 2018-01-02 NOTE — Progress Notes (Signed)
CBC stable. CMP WNL.

## 2018-01-15 ENCOUNTER — Encounter: Payer: Self-pay | Admitting: General Surgery

## 2018-01-15 ENCOUNTER — Ambulatory Visit (INDEPENDENT_AMBULATORY_CARE_PROVIDER_SITE_OTHER): Payer: Medicare Other | Admitting: General Surgery

## 2018-01-15 VITALS — BP 136/81 | HR 64 | Temp 99.1°F | Resp 20 | Wt 125.0 lb

## 2018-01-15 DIAGNOSIS — K4090 Unilateral inguinal hernia, without obstruction or gangrene, not specified as recurrent: Secondary | ICD-10-CM

## 2018-01-15 NOTE — Progress Notes (Signed)
Richard Manning; [id number]; 03/03/1956   HPI Patient is a 62-year-old white male who was referred to my care by Dr. Hall for evaluation treatment of a right anal hernia.  Patient states that he recently noticed a hernia develop over the past year.  Since that time, he has had intermittent right groin pain.  This radiates to the right testicle.  Is made worse with straining.  It is now starting to increase in frequency and intensity.  He currently has 0 out of 10 pain.  It is starting to affect his daily lifestyle. Past Medical History:  Diagnosis Date  . RA (rheumatoid arthritis) (HCC)     History reviewed. No pertinent surgical history.  Family History  Problem Relation Age of Onset  . Atrial fibrillation Mother   . Cancer Mother        breast     Current Outpatient Medications on File Prior to Visit  Medication Sig Dispense Refill  . folic acid (FOLVITE) 1 MG tablet Take 2 tablets (2 mg total) by mouth daily. 180 tablet 3  . methotrexate (RHEUMATREX) 2.5 MG tablet TAKE 5 TABLETS BY MOUTH TWICE WEEKLY. CAUTION:CHEMOTHERAPY. PROTECT FROM LIGHT. 120 tablet 0  . predniSONE (DELTASONE) 10 MG tablet Take 1 tablet (10 mg total) by mouth daily with breakfast. 30 tablet 2   No current facility-administered medications on file prior to visit.     Allergies  Allergen Reactions  . Benadryl [Diphenhydramine]     Makes pt feel jittery/nervous   . Peanuts [Peanut Oil]     Social History   Substance and Sexual Activity  Alcohol Use No    Social History   Tobacco Use  Smoking Status Current Every Day Smoker  . Packs/day: 0.50  . Types: Cigarettes  Smokeless Tobacco Never Used    Review of Systems  Constitutional: Positive for malaise/fatigue.  HENT: Negative.   Eyes: Positive for pain.  Respiratory: Negative.   Cardiovascular: Negative.   Gastrointestinal: Negative.   Genitourinary: Negative.   Musculoskeletal: Positive for joint pain.  Skin: Negative.   Neurological:  Negative.   Endo/Heme/Allergies: Negative.   Psychiatric/Behavioral: Negative.     Objective   Vitals:   01/15/18 1123  BP: 136/81  Pulse: 64  Resp: 20  Temp: 99.1 F (37.3 C)    Physical Exam  Constitutional: He is oriented to person, place, and time. He appears well-developed and well-nourished. No distress.  HENT:  Head: Normocephalic and atraumatic.  Cardiovascular: Normal rate, regular rhythm and normal heart sounds. Exam reveals no gallop and no friction rub.  No murmur heard. Pulmonary/Chest: Effort normal and breath sounds normal. No stridor. No respiratory distress. He has no wheezes. He has no rales.  Abdominal: Soft. Bowel sounds are normal. He exhibits no distension and no mass. There is no tenderness. There is no guarding. A hernia is present.  Reducible right inguinal hernia noted.  Genitourinary:  Genitourinary Comments: Genitourinary examination is within normal limits.  Neurological: He is alert and oriented to person, place, and time.  Skin: Skin is warm and dry.  Vitals reviewed. Dr. Hall's notes reviewed  Assessment  Right inguinal hernia Plan   Patient is scheduled for right inguinal herniorrhaphy with mesh on 01/23/2018.  The risks and benefits of the procedure including bleeding, infection, mesh use, and the possibility of recurrence of the hernia were fully explained to the patient, who gave informed consent. 

## 2018-01-15 NOTE — Patient Instructions (Signed)

## 2018-01-15 NOTE — H&P (Signed)
Richard Manning; [id number]; 06-24-1955   HPI Patient is a 62 year old white male who was referred to my care by Dr. Margo Aye for evaluation treatment of a right anal hernia.  Patient states that he recently noticed a hernia develop over the past year.  Since that time, he has had intermittent right groin pain.  This radiates to the right testicle.  Is made worse with straining.  It is now starting to increase in frequency and intensity.  He currently has 0 out of 10 pain.  It is starting to affect his daily lifestyle. Past Medical History:  Diagnosis Date  . RA (rheumatoid arthritis) (HCC)     History reviewed. No pertinent surgical history.  Family History  Problem Relation Age of Onset  . Atrial fibrillation Mother   . Cancer Mother        breast     Current Outpatient Medications on File Prior to Visit  Medication Sig Dispense Refill  . folic acid (FOLVITE) 1 MG tablet Take 2 tablets (2 mg total) by mouth daily. 180 tablet 3  . methotrexate (RHEUMATREX) 2.5 MG tablet TAKE 5 TABLETS BY MOUTH TWICE WEEKLY. CAUTION:CHEMOTHERAPY. PROTECT FROM LIGHT. 120 tablet 0  . predniSONE (DELTASONE) 10 MG tablet Take 1 tablet (10 mg total) by mouth daily with breakfast. 30 tablet 2   No current facility-administered medications on file prior to visit.     Allergies  Allergen Reactions  . Benadryl [Diphenhydramine]     Makes pt feel jittery/nervous   . Peanuts [Peanut Oil]     Social History   Substance and Sexual Activity  Alcohol Use No    Social History   Tobacco Use  Smoking Status Current Every Day Smoker  . Packs/day: 0.50  . Types: Cigarettes  Smokeless Tobacco Never Used    Review of Systems  Constitutional: Positive for malaise/fatigue.  HENT: Negative.   Eyes: Positive for pain.  Respiratory: Negative.   Cardiovascular: Negative.   Gastrointestinal: Negative.   Genitourinary: Negative.   Musculoskeletal: Positive for joint pain.  Skin: Negative.   Neurological:  Negative.   Endo/Heme/Allergies: Negative.   Psychiatric/Behavioral: Negative.     Objective   Vitals:   01/15/18 1123  BP: 136/81  Pulse: 64  Resp: 20  Temp: 99.1 F (37.3 C)    Physical Exam  Constitutional: He is oriented to person, place, and time. He appears well-developed and well-nourished. No distress.  HENT:  Head: Normocephalic and atraumatic.  Cardiovascular: Normal rate, regular rhythm and normal heart sounds. Exam reveals no gallop and no friction rub.  No murmur heard. Pulmonary/Chest: Effort normal and breath sounds normal. No stridor. No respiratory distress. He has no wheezes. He has no rales.  Abdominal: Soft. Bowel sounds are normal. He exhibits no distension and no mass. There is no tenderness. There is no guarding. A hernia is present.  Reducible right inguinal hernia noted.  Genitourinary:  Genitourinary Comments: Genitourinary examination is within normal limits.  Neurological: He is alert and oriented to person, place, and time.  Skin: Skin is warm and dry.  Vitals reviewed. Dr. Scharlene Gloss notes reviewed  Assessment  Right inguinal hernia Plan   Patient is scheduled for right inguinal herniorrhaphy with mesh on 01/23/2018.  The risks and benefits of the procedure including bleeding, infection, mesh use, and the possibility of recurrence of the hernia were fully explained to the patient, who gave informed consent.

## 2018-01-19 NOTE — Patient Instructions (Signed)
Richard Manning  01/19/2018     @[email or web address] @   Your procedure is scheduled on   01/23/2018  Report to [facility] at  900  A.M.  Call this number if you have problems the morning of surgery:  [phone]   Remember:  Do not eat or drink after midnight.  You may drink clear liquids until  12 midnight 01/22/2018 .  Clear liquids allowed are:                    Water, Juice (non-citric and without pulp), Carbonated beverages, Clear Tea, Black Coffee only, Plain Jell-O only, Gatorade and Plain Popsicles only    Take these medicines the morning of surgery with A SIP OF WATER  None    Do not wear jewelry, make-up or nail polish.  Do not wear lotions, powders, or perfumes, or deodorant.  Do not shave 48 hours prior to surgery.  Men may shave face and neck.  Do not bring valuables to the hospital.  [facility] is not responsible for any belongings or valuables.  Contacts, dentures or bridgework may not be worn into surgery.  Leave your suitcase in the car.  After surgery it may be brought to your room.  For patients admitted to the hospital, discharge time will be determined by your treatment team.  Patients discharged the day of surgery will not be allowed to drive home.   Name and phone number of your driver:   family Special instructions:  None  Please read over the following fact sheets that you were given. Anesthesia Post-op Instructions and Care and Recovery After Surgery       Open Hernia Repair, Adult Open hernia repair is a surgical procedure to fix a hernia. A hernia occurs when an internal organ or tissue pushes out through a weak spot in the abdominal wall muscles. Hernias commonly occur in the groin and around the navel. Most hernias tend to get worse over time. Often, surgery is done to prevent the hernia from becoming bigger, uncomfortable, or an emergency. Emergency surgery may be needed if abdominal contents get stuck in the opening  (incarcerated hernia) or the blood supply gets cut off (strangulated hernia). In an open repair, an incision is made in the abdomen to perform the surgery. Tell a health care provider about:  Any allergies you have.  All medicines you are taking, including vitamins, herbs, eye drops, creams, and over-the-counter medicines.  Any problems you or family members have had with anesthetic medicines.  Any blood or bone disorders you have.  Any surgeries you have had.  Any medical conditions you have, including any recent cold or flu symptoms.  Whether you are pregnant or may be pregnant. What are the risks? Generally, this is a safe procedure. However, problems may occur, including:  Long-lasting (chronic) pain.  Bleeding.  Infection.  Damage to the testicle. This can cause shrinking or swelling.  Damage to the bladder, blood vessels, intestine, or nerves near the hernia.  Trouble passing urine.  Allergic reactions to medicines.  Return of the hernia.  What happens before the procedure? Staying hydrated Follow instructions from your health care provider about hydration, which may include:  Up to 2 hours before the procedure - you may continue to drink clear liquids, such as water, clear fruit juice, black coffee, and plain tea.  Eating and drinking restrictions Follow instructions from your health care provider about eating  and drinking, which may include:  8 hours before the procedure - stop eating heavy meals or foods such as meat, fried foods, or fatty foods.  6 hours before the procedure - stop eating light meals or foods, such as toast or cereal.  6 hours before the procedure - stop drinking milk or drinks that contain milk.  2 hours before the procedure - stop drinking clear liquids.  Medicines  Ask your health care provider about: ? Changing or stopping your regular medicines. This is especially important if you are taking diabetes medicines or blood  thinners. ? Taking medicines such as aspirin and ibuprofen. These medicines can thin your blood. Do not take these medicines before your procedure if your health care provider instructs you not to.  You may be given antibiotic medicine to help prevent infection. General instructions  You may have blood tests or imaging studies.  Ask your health care provider how your surgical site will be marked or identified.  If you smoke, do not smoke for at least 2 weeks before your procedure or for as long as told by your health care provider.  Let your health care provider know if you develop a cold or any infection before your surgery.  Plan to have someone take you home from the hospital or clinic.  If you will be going home right after the procedure, plan to have someone with you for 24 hours. What happens during the procedure?  To reduce your risk of infection: ? Your health care team will wash or sanitize their hands. ? Your skin will be washed with soap. ? Hair may be removed from the surgical area.  An IV tube will be inserted into one of your veins.  You will be given one or more of the following: ? A medicine to help you relax (sedative). ? A medicine to numb the area (local anesthetic). ? A medicine to make you fall asleep (general anesthetic).  Your surgeon will make an incision over the hernia.  The tissues of the hernia will be moved back into place.  The edges of the hernia may be stitched together.  The opening in the abdominal muscles will be closed with stitches (sutures). Or, your surgeon will place a mesh patch made of manmade (synthetic) material over the opening.  The incision will be closed.  A bandage (dressing) may be placed over the incision. The procedure may vary among health care providers and hospitals. What happens after the procedure?  Your blood pressure, heart rate, breathing rate, and blood oxygen level will be monitored until the medicines you were  given have worn off.  You may be given medicine for pain.  Do not drive for 24 hours if you received a sedative. This information is not intended to replace advice given to you by your health care provider. Make sure you discuss any questions you have with your health care provider. Document Released: 10/09/2000 Document Revised: 11/03/2015 Document Reviewed: 09/27/2015 Elsevier Interactive Patient Education  2018 [place], Adult, Care After These instructions give you information about caring for yourself after your procedure. Your doctor may also give you more specific instructions. If you have problems or questions, contact your doctor. Follow these instructions at home: Surgical cut (incision) care   Follow instructions from your doctor about how to take care of your surgical cut area. Make sure you: ? Wash your hands with soap and water before you change your bandage (dressing). If you  cannot use soap and water, use hand sanitizer. ? Change your bandage as told by your doctor. ? Leave stitches (sutures), skin glue, or skin tape (adhesive) strips in place. They may need to stay in place for 2 weeks or longer. If tape strips get loose and curl up, you may trim the loose edges. Do not remove tape strips completely unless your doctor says it is okay.  Check your surgical cut every day for signs of infection. Check for: ? More redness, swelling, or pain. ? More fluid or blood. ? Warmth. ? Pus or a bad smell. Activity  Do not drive or use heavy machinery while taking prescription pain medicine. Do not drive until your doctor says it is okay.  Until your doctor says it is okay: ? Do not lift anything that is heavier than 10 lb (4.5 kg). ? Do not play contact sports.  Return to your normal activities as told by your doctor. Ask your doctor what activities are safe. General instructions  To prevent or treat having a hard time pooping (constipation) while you  are taking prescription pain medicine, your doctor may recommend that you: ? Drink enough fluid to keep your pee (urine) clear or pale yellow. ? Take over-the-counter or prescription medicines. ? Eat foods that are high in fiber, such as fresh fruits and vegetables, whole grains, and beans. ? Limit foods that are high in fat and processed sugars, such as fried and sweet foods.  Take over-the-counter and prescription medicines only as told by your doctor.  Do not take baths, swim, or use a hot tub until your doctor says it is okay.  Keep all follow-up visits as told by your doctor. This is important. Contact a doctor if:  You develop a rash.  You have more redness, swelling, or pain around your surgical cut.  You have more fluid or blood coming from your surgical cut.  Your surgical cut feels warm to the touch.  You have pus or a bad smell coming from your surgical cut.  You have a fever or chills.  You have blood in your poop (stool).  You have not pooped in 2-3 days.  Medicine does not help your pain. Get help right away if:  You have chest pain or you are short of breath.  You feel light-headed.  You feel weak and dizzy (feel faint).  You have very bad pain.  You throw up (vomit) and your pain is worse. This information is not intended to replace advice given to you by your health care provider. Make sure you discuss any questions you have with your health care provider. Document Released: 05/06/2014 Document Revised: 11/03/2015 Document Reviewed: 09/27/2015 Elsevier Interactive Patient Education  2018 [place] Anesthesia, Adult General anesthesia is the use of medicines to make a person "go to sleep" (be unconscious) for a medical procedure. General anesthesia is often recommended when a procedure:  Is long.  Requires you to be still or in an unusual position.  Is major and can cause you to lose blood.  Is impossible to do without general  anesthesia.  The medicines used for general anesthesia are called general anesthetics. In addition to making you sleep, the medicines:  Prevent pain.  Control your blood pressure.  Relax your muscles.  Tell a health care provider about:  Any allergies you have.  All medicines you are taking, including vitamins, herbs, eye drops, creams, and over-the-counter medicines.  Any problems you or family members have  had with anesthetic medicines.  Types of anesthetics you have had in the past.  Any bleeding disorders you have.  Any surgeries you have had.  Any medical conditions you have.  Any history of heart or lung conditions, such as heart failure, sleep apnea, or chronic obstructive pulmonary disease (COPD).  Whether you are pregnant or may be pregnant.  Whether you use tobacco, alcohol, marijuana, or street drugs.  Any history of Armed forces logistics/support/administrative officer.  Any history of depression or anxiety. What are the risks? Generally, this is a safe procedure. However, problems may occur, including:  Allergic reaction to anesthetics.  Lung and heart problems.  Inhaling food or liquids from your stomach into your lungs (aspiration).  Injury to nerves.  Waking up during your procedure and being unable to move (rare).  Extreme agitation or a state of mental confusion (delirium) when you wake up from the anesthetic.  Air in the bloodstream, which can lead to stroke.  These problems are more likely to develop if you are having a major surgery or if you have an advanced medical condition. You can prevent some of these complications by answering all of your health care provider's questions thoroughly and by following all pre-procedure instructions. General anesthesia can cause side effects, including:  Nausea or vomiting  A sore throat from the breathing tube.  Feeling cold or shivery.  Feeling tired, washed out, or achy.  Sleepiness or drowsiness.  Confusion or  agitation.  What happens before the procedure? Staying hydrated Follow instructions from your health care provider about hydration, which may include:  Up to 2 hours before the procedure - you may continue to drink clear liquids, such as water, clear fruit juice, black coffee, and plain tea.  Eating and drinking restrictions Follow instructions from your health care provider about eating and drinking, which may include:  8 hours before the procedure - stop eating heavy meals or foods such as meat, fried foods, or fatty foods.  6 hours before the procedure - stop eating light meals or foods, such as toast or cereal.  6 hours before the procedure - stop drinking milk or drinks that contain milk.  2 hours before the procedure - stop drinking clear liquids.  Medicines  Ask your health care provider about: ? Changing or stopping your regular medicines. This is especially important if you are taking diabetes medicines or blood thinners. ? Taking medicines such as aspirin and ibuprofen. These medicines can thin your blood. Do not take these medicines before your procedure if your health care provider instructs you not to. ? Taking new dietary supplements or medicines. Do not take these during the week before your procedure unless your health care provider approves them.  If you are told to take a medicine or to continue taking a medicine on the day of the procedure, take the medicine with sips of water. General instructions   Ask if you will be going home the same day, the following day, or after a longer hospital stay. ? Plan to have someone take you home. ? Plan to have someone stay with you for the first 24 hours after you leave the hospital or clinic.  For 3-6 weeks before the procedure, try not to use any tobacco products, such as cigarettes, chewing tobacco, and e-cigarettes.  You may brush your teeth on the morning of the procedure, but make sure to spit out the toothpaste. What  happens during the procedure?  You will be given anesthetics through a mask  and through an IV tube in one of your veins.  You may receive medicine to help you relax (sedative).  As soon as you are asleep, a breathing tube may be used to help you breathe.  An anesthesia specialist will stay with you throughout the procedure. He or she will help keep you comfortable and safe by continuing to give you medicines and adjusting the amount of medicine that you get. He or she will also watch your blood pressure, pulse, and oxygen levels to make sure that the anesthetics do not cause any problems.  If a breathing tube was used to help you breathe, it will be removed before you wake up. The procedure may vary among health care providers and hospitals. What happens after the procedure?  You will wake up, often slowly, after the procedure is complete, usually in a recovery area.  Your blood pressure, heart rate, breathing rate, and blood oxygen level will be monitored until the medicines you were given have worn off.  You may be given medicine to help you calm down if you feel anxious or agitated.  If you will be going home the same day, your health care provider may check to make sure you can stand, drink, and urinate.  Your health care providers will treat your pain and side effects before you go home.  Do not drive for 24 hours if you received a sedative.  You may: ? Feel nauseous and vomit. ? Have a sore throat. ? Have mental slowness. ? Feel cold or shivery. ? Feel sleepy. ? Feel tired. ? Feel sore or achy, even in parts of your body where you did not have surgery. This information is not intended to replace advice given to you by your health care provider. Make sure you discuss any questions you have with your health care provider. Document Released: 07/23/2007 Document Revised: 09/26/2015 Document Reviewed: 03/30/2015 Elsevier Interactive Patient Education  2018 [place]  Anesthesia, Adult, Care After These instructions provide you with information about caring for yourself after your procedure. Your health care provider may also give you more specific instructions. Your treatment has been planned according to current medical practices, but problems sometimes occur. Call your health care provider if you have any problems or questions after your procedure. What can I expect after the procedure? After the procedure, it is common to have:  Vomiting.  A sore throat.  Mental slowness.  It is common to feel:  Nauseous.  Cold or shivery.  Sleepy.  Tired.  Sore or achy, even in parts of your body where you did not have surgery.  Follow these instructions at home: For at least 24 hours after the procedure:  Do not: ? Participate in activities where you could fall or become injured. ? Drive. ? Use heavy machinery. ? Drink alcohol. ? Take sleeping pills or medicines that cause drowsiness. ? Make important decisions or sign legal documents. ? Take care of children on your own.  Rest. Eating and drinking  If you vomit, drink water, juice, or soup when you can drink without vomiting.  Drink enough fluid to keep your urine clear or pale yellow.  Make sure you have little or no nausea before eating solid foods.  Follow the diet recommended by your health care provider. General instructions  Have a responsible adult stay with you until you are awake and alert.  Return to your normal activities as told by your health care provider. Ask your health care provider what  activities are safe for you.  Take over-the-counter and prescription medicines only as told by your health care provider.  If you smoke, do not smoke without supervision.  Keep all follow-up visits as told by your health care provider. This is important. Contact a health care provider if:  You continue to have nausea or vomiting at home, and medicines are not helpful.  You cannot  drink fluids or start eating again.  You cannot urinate after 8-12 hours.  You develop a skin rash.  You have fever.  You have increasing redness at the site of your procedure. Get help right away if:  You have difficulty breathing.  You have chest pain.  You have unexpected bleeding.  You feel that you are having a life-threatening or urgent problem. This information is not intended to replace advice given to you by your health care provider. Make sure you discuss any questions you have with your health care provider. Document Released: 07/22/2000 Document Revised: 09/18/2015 Document Reviewed: 03/30/2015 Elsevier Interactive Patient Education  [employer].

## 2018-01-21 ENCOUNTER — Other Ambulatory Visit: Payer: Self-pay

## 2018-01-21 ENCOUNTER — Encounter (HOSPITAL_COMMUNITY)
Admission: RE | Admit: 2018-01-21 | Discharge: 2018-01-21 | Disposition: A | Discharge status: Home / Self Care | Payer: Medicare Other | Source: Ambulatory Visit | Attending: General Surgery | Admitting: General Surgery

## 2018-01-21 ENCOUNTER — Encounter (HOSPITAL_COMMUNITY): Payer: Self-pay

## 2018-01-21 DIAGNOSIS — M069 Rheumatoid arthritis, unspecified: Secondary | ICD-10-CM | POA: Diagnosis not present

## 2018-01-21 DIAGNOSIS — F17210 Nicotine dependence, cigarettes, uncomplicated: Secondary | ICD-10-CM | POA: Diagnosis not present

## 2018-01-21 DIAGNOSIS — Z01810 Encounter for preprocedural cardiovascular examination: Secondary | ICD-10-CM

## 2018-01-21 DIAGNOSIS — K4090 Unilateral inguinal hernia, without obstruction or gangrene, not specified as recurrent: Secondary | ICD-10-CM | POA: Diagnosis not present

## 2018-01-21 DIAGNOSIS — I4510 Unspecified right bundle-branch block: Secondary | ICD-10-CM | POA: Insufficient documentation

## 2018-01-21 DIAGNOSIS — Z79899 Other long term (current) drug therapy: Secondary | ICD-10-CM | POA: Diagnosis not present

## 2018-01-21 DIAGNOSIS — Z7952 Long term (current) use of systemic steroids: Secondary | ICD-10-CM | POA: Diagnosis not present

## 2018-01-21 DIAGNOSIS — K4190 Unilateral femoral hernia, without obstruction or gangrene, not specified as recurrent: Secondary | ICD-10-CM | POA: Diagnosis not present

## 2018-01-23 ENCOUNTER — Ambulatory Visit (HOSPITAL_COMMUNITY): Payer: Medicare Other | Admitting: Anesthesiology

## 2018-01-23 ENCOUNTER — Ambulatory Visit (HOSPITAL_COMMUNITY)
Admission: RE | Admit: 2018-01-23 | Discharge: 2018-01-23 | Disposition: A | Discharge status: Home / Self Care | Payer: Medicare Other | Source: Ambulatory Visit | Attending: General Surgery | Admitting: General Surgery

## 2018-01-23 ENCOUNTER — Encounter (HOSPITAL_COMMUNITY): Payer: Self-pay | Admitting: *Deleted

## 2018-01-23 ENCOUNTER — Encounter (HOSPITAL_COMMUNITY)
Admission: RE | Disposition: A | Discharge status: Home / Self Care | Payer: Self-pay | Source: Ambulatory Visit | Attending: General Surgery

## 2018-01-23 DIAGNOSIS — F17210 Nicotine dependence, cigarettes, uncomplicated: Secondary | ICD-10-CM | POA: Insufficient documentation

## 2018-01-23 DIAGNOSIS — Z7952 Long term (current) use of systemic steroids: Secondary | ICD-10-CM | POA: Insufficient documentation

## 2018-01-23 DIAGNOSIS — Z79899 Other long term (current) drug therapy: Secondary | ICD-10-CM | POA: Diagnosis not present

## 2018-01-23 DIAGNOSIS — K4090 Unilateral inguinal hernia, without obstruction or gangrene, not specified as recurrent: Secondary | ICD-10-CM | POA: Insufficient documentation

## 2018-01-23 DIAGNOSIS — M069 Rheumatoid arthritis, unspecified: Secondary | ICD-10-CM | POA: Insufficient documentation

## 2018-01-23 DIAGNOSIS — K4190 Unilateral femoral hernia, without obstruction or gangrene, not specified as recurrent: Secondary | ICD-10-CM | POA: Diagnosis not present

## 2018-01-23 HISTORY — PX: INGUINAL HERNIA REPAIR: SHX194

## 2018-01-23 HISTORY — PX: FEMORAL HERNIA REPAIR: SHX632

## 2018-01-23 SURGERY — REPAIR, HERNIA, INGUINAL, ADULT
Anesthesia: General | Site: Inguinal | Laterality: Right

## 2018-01-23 MED ORDER — LACTATED RINGERS IV SOLN
INTRAVENOUS | Status: DC
  Administered 2018-01-23 (×2): via INTRAVENOUS

## 2018-01-23 MED ORDER — HYDROMORPHONE HCL 1 MG/ML IJ SOLN
0.2500 mg | INTRAMUSCULAR | Status: DC | PRN
  Administered 2018-01-23: 0.5 mg via INTRAVENOUS
  Filled 2018-01-23: qty 0.5

## 2018-01-23 MED ORDER — CEFAZOLIN SODIUM-DEXTROSE 2-4 GM/100ML-% IV SOLN
2.0000 g | INTRAVENOUS | Status: AC
  Administered 2018-01-23: 2 g via INTRAVENOUS

## 2018-01-23 MED ORDER — CHLORHEXIDINE GLUCONATE CLOTH 2 % EX PADS: 6.0000 | MEDICATED_PAD | Freq: Once | CUTANEOUS | Status: DC

## 2018-01-23 MED ORDER — GLYCOPYRROLATE 0.2 MG/ML IJ SOLN
INTRAMUSCULAR | Status: AC
  Filled 2018-01-23: qty 1

## 2018-01-23 MED ORDER — OXYCODONE-ACETAMINOPHEN 7.5-325 MG PO TABS: 1.0000 | ORAL_TABLET | Freq: Four times a day (QID) | ORAL | 0 refills | Status: DC | PRN

## 2018-01-23 MED ORDER — ONDANSETRON HCL 4 MG/2ML IJ SOLN
INTRAMUSCULAR | Status: AC
  Filled 2018-01-23: qty 2

## 2018-01-23 MED ORDER — PROMETHAZINE HCL 25 MG/ML IJ SOLN: 6.2500 mg | INTRAMUSCULAR | Status: DC | PRN

## 2018-01-23 MED ORDER — PHENYLEPHRINE HCL 10 MG/ML IJ SOLN
INTRAMUSCULAR | Status: AC
  Filled 2018-01-23: qty 1

## 2018-01-23 MED ORDER — SODIUM CHLORIDE 0.9 % IR SOLN
Status: DC | PRN
  Administered 2018-01-23: 1000 mL

## 2018-01-23 MED ORDER — FENTANYL CITRATE (PF) 250 MCG/5ML IJ SOLN
INTRAMUSCULAR | Status: AC
  Filled 2018-01-23: qty 5

## 2018-01-23 MED ORDER — KETOROLAC TROMETHAMINE 30 MG/ML IJ SOLN
30.0000 mg | Freq: Once | INTRAMUSCULAR | Status: AC
  Administered 2018-01-23: 30 mg via INTRAVENOUS

## 2018-01-23 MED ORDER — LIDOCAINE HCL (CARDIAC) PF 50 MG/5ML IV SOSY
PREFILLED_SYRINGE | INTRAVENOUS | Status: DC | PRN
  Administered 2018-01-23: 40 mg via INTRAVENOUS

## 2018-01-23 MED ORDER — CEFAZOLIN SODIUM-DEXTROSE 2-4 GM/100ML-% IV SOLN
INTRAVENOUS | Status: AC
  Filled 2018-01-23: qty 100

## 2018-01-23 MED ORDER — LIDOCAINE HCL (PF) 1 % IJ SOLN
INTRAMUSCULAR | Status: AC
  Filled 2018-01-23: qty 10

## 2018-01-23 MED ORDER — MIDAZOLAM HCL 2 MG/2ML IJ SOLN
INTRAMUSCULAR | Status: AC
  Filled 2018-01-23: qty 2

## 2018-01-23 MED ORDER — BUPIVACAINE LIPOSOME 1.3 % IJ SUSP
INTRAMUSCULAR | Status: AC
  Filled 2018-01-23: qty 20

## 2018-01-23 MED ORDER — BUPIVACAINE IN DEXTROSE 0.75-8.25 % IT SOLN
INTRATHECAL | Status: AC
  Filled 2018-01-23: qty 2

## 2018-01-23 MED ORDER — ONDANSETRON HCL 4 MG/2ML IJ SOLN
INTRAMUSCULAR | Status: DC | PRN
  Administered 2018-01-23: 4 mg via INTRAVENOUS

## 2018-01-23 MED ORDER — KETOROLAC TROMETHAMINE 30 MG/ML IJ SOLN
INTRAMUSCULAR | Status: AC
  Filled 2018-01-23: qty 1

## 2018-01-23 MED ORDER — FENTANYL CITRATE (PF) 100 MCG/2ML IJ SOLN
INTRAMUSCULAR | Status: DC | PRN
  Administered 2018-01-23 (×2): 25 ug via INTRAVENOUS
  Administered 2018-01-23: 50 ug via INTRAVENOUS

## 2018-01-23 MED ORDER — HYDROCODONE-ACETAMINOPHEN 7.5-325 MG PO TABS: 1.0000 | ORAL_TABLET | Freq: Once | ORAL | Status: DC | PRN

## 2018-01-23 MED ORDER — PROPOFOL 10 MG/ML IV BOLUS
INTRAVENOUS | Status: AC
  Filled 2018-01-23: qty 20

## 2018-01-23 MED ORDER — MEPERIDINE HCL 50 MG/ML IJ SOLN: 6.2500 mg | INTRAMUSCULAR | Status: DC | PRN

## 2018-01-23 MED ORDER — MIDAZOLAM HCL 5 MG/5ML IJ SOLN
INTRAMUSCULAR | Status: DC | PRN
  Administered 2018-01-23: 2 mg via INTRAVENOUS

## 2018-01-23 MED ORDER — BUPIVACAINE LIPOSOME 1.3 % IJ SUSP
INTRAMUSCULAR | Status: DC | PRN
  Administered 2018-01-23: 20 mL

## 2018-01-23 MED ORDER — SODIUM CHLORIDE 0.9% FLUSH
INTRAVENOUS | Status: AC
  Filled 2018-01-23: qty 10

## 2018-01-23 MED ORDER — LACTATED RINGERS IV SOLN: INTRAVENOUS | Status: DC

## 2018-01-23 MED ORDER — ARTIFICIAL TEARS OPHTHALMIC OINT
TOPICAL_OINTMENT | OPHTHALMIC | Status: AC
  Filled 2018-01-23: qty 7

## 2018-01-23 MED ORDER — SODIUM CHLORIDE 0.9 % IJ SOLN
INTRAMUSCULAR | Status: AC
  Filled 2018-01-23: qty 10

## 2018-01-23 MED ORDER — PROPOFOL 10 MG/ML IV BOLUS
INTRAVENOUS | Status: DC | PRN
  Administered 2018-01-23: 150 mg via INTRAVENOUS

## 2018-01-23 MED ORDER — EPHEDRINE SULFATE 50 MG/ML IJ SOLN
INTRAMUSCULAR | Status: AC
  Filled 2018-01-23: qty 1

## 2018-01-23 SURGICAL SUPPLY — 37 items
CLOTH BEACON ORANGE TIMEOUT ST (SAFETY) ×4 IMPLANT
COVER LIGHT HANDLE STERIS (MISCELLANEOUS) ×8 IMPLANT
DERMABOND ADVANCED (GAUZE/BANDAGES/DRESSINGS) ×2
DERMABOND ADVANCED .7 DNX12 (GAUZE/BANDAGES/DRESSINGS) ×2 IMPLANT
DRAIN PENROSE 18X1/2 LTX STRL (DRAIN) ×4 IMPLANT
ELECT REM PT RETURN 9FT ADLT (ELECTROSURGICAL) ×4
ELECTRODE REM PT RTRN 9FT ADLT (ELECTROSURGICAL) ×2 IMPLANT
GAUZE SPONGE 4X4 12PLY STRL (GAUZE/BANDAGES/DRESSINGS) ×4 IMPLANT
GLOVE BIO SURGEON STRL SZ7 (GLOVE) ×4 IMPLANT
GLOVE BIOGEL PI IND STRL 7.0 (GLOVE) ×4 IMPLANT
GLOVE BIOGEL PI INDICATOR 7.0 (GLOVE) ×4
GLOVE SURG SS PI 7.5 STRL IVOR (GLOVE) ×4 IMPLANT
GOWN STRL REUS W/ TWL XL LVL3 (GOWN DISPOSABLE) ×2 IMPLANT
GOWN STRL REUS W/TWL LRG LVL3 (GOWN DISPOSABLE) ×8 IMPLANT
GOWN STRL REUS W/TWL XL LVL3 (GOWN DISPOSABLE) ×2
INST SET MINOR GENERAL (KITS) ×4 IMPLANT
KIT TURNOVER KIT A (KITS) ×4 IMPLANT
MANIFOLD NEPTUNE II (INSTRUMENTS) ×4 IMPLANT
MESH HERNIA 3X6 (Mesh General) ×4 IMPLANT
MESH MARLEX PLUG MEDIUM (Mesh General) ×4 IMPLANT
NEEDLE HYPO 21X1.5 SAFETY (NEEDLE) ×4 IMPLANT
NS IRRIG 1000ML POUR BTL (IV SOLUTION) ×4 IMPLANT
PACK MINOR (CUSTOM PROCEDURE TRAY) ×4 IMPLANT
PAD ARMBOARD 7.5X6 YLW CONV (MISCELLANEOUS) ×4 IMPLANT
SET BASIN LINEN APH (SET/KITS/TRAYS/PACK) ×4 IMPLANT
SOL PREP PROV IODINE SCRUB 4OZ (MISCELLANEOUS) ×4 IMPLANT
SUT MNCRL AB 4-0 PS2 18 (SUTURE) ×4 IMPLANT
SUT NOVA NAB GS-22 2 2-0 T-19 (SUTURE) ×8 IMPLANT
SUT PROLENE 2 0 SH 30 (SUTURE) ×24 IMPLANT
SUT SILK 3 0 (SUTURE)
SUT SILK 3-0 18XBRD TIE 12 (SUTURE) IMPLANT
SUT VIC AB 2-0 CT1 27 (SUTURE) ×2
SUT VIC AB 2-0 CT1 TAPERPNT 27 (SUTURE) ×2 IMPLANT
SUT VIC AB 3-0 SH 27 (SUTURE) ×2
SUT VIC AB 3-0 SH 27X BRD (SUTURE) ×2 IMPLANT
SUT VICRYL AB 3 0 TIES (SUTURE) IMPLANT
SYR 20CC LL (SYRINGE) ×4 IMPLANT

## 2018-01-23 NOTE — Discharge Instructions (Signed)
Monitored Anesthesia Care, Care After °These instructions provide you with information about caring for yourself after your procedure. Your health care provider may also give you more specific instructions. Your treatment has been planned according to current medical practices, but problems sometimes occur. Call your health care provider if you have any problems or questions after your procedure. °What can I expect after the procedure? °After your procedure, it is common to: °· Feel sleepy for several hours. °· Feel clumsy and have poor balance for several hours. °· Feel forgetful about what happened after the procedure. °· Have poor judgment for several hours. °· Feel nauseous or vomit. °· Have a sore throat if you had a breathing tube during the procedure. ° °Follow these instructions at home: °For at least 24 hours after the procedure: ° °· Do not: °? Participate in activities in which you could fall or become injured. °? Drive. °? Use heavy machinery. °? Drink alcohol. °? Take sleeping pills or medicines that cause drowsiness. °? Make important decisions or sign legal documents. °? Take care of children on your own. °· Rest. °Eating and drinking °· Follow the diet that is recommended by your health care provider. °· If you vomit, drink water, juice, or soup when you can drink without vomiting. °· Make sure you have little or no nausea before eating solid foods. °General instructions °· Have a responsible adult stay with you until you are awake and alert. °· Take over-the-counter and prescription medicines only as told by your health care provider. °· If you smoke, do not smoke without supervision. °· Keep all follow-up visits as told by your health care provider. This is important. °Contact a health care provider if: °· You keep feeling nauseous or you keep vomiting. °· You feel light-headed. °· You develop a rash. °· You have a fever. °Get help right away if: °· You have trouble breathing. °This information is  not intended to replace advice given to you by your health care provider. Make sure you discuss any questions you have with your health care provider. °Document Released: 08/06/2015 Document Revised: 12/06/2015 Document Reviewed: 08/06/2015 °Elsevier Interactive Patient Education © 2018 [employer] Inc[employer] °Open Hernia Repair, Adult, Care After °This sheet gives you information about how to care for yourself after your procedure. Your health care provider may also give you more specific instructions. If you have problems or questions, contact your health care provider. °What can I expect after the procedure? °After the procedure, it is common to have: °· Mild discomfort. °· Slight bruising. °· Minor swelling. °· Pain in the abdomen. ° °Follow these instructions at home: °Incision care ° °· Follow instructions from your health care provider about how to take care of your incision area. Make sure you: °? Wash your hands with soap and water before you change your bandage (dressing). If soap and water are not available, use hand sanitizer. °? Change your dressing as told by your health care provider. °? Leave stitches (sutures), skin glue, or adhesive strips in place. These skin closures may need to stay in place for 2 weeks or longer. If adhesive strip edges start to loosen and curl up, you may trim the loose edges. Do not remove adhesive strips completely unless your health care provider tells you to do that. °· Check your incision area every day for signs of infection. Check for: °? More redness, swelling, or pain. °? More fluid or blood. °? Warmth. °? Pus or a bad smell. °Activity °· Do not   drive or use heavy machinery while taking prescription pain medicine. Do not drive until your health care provider approves. °· Until your health care provider approves: °? Do not lift anything that is heavier than 10 lb (4.5 kg). °? Do not play contact sports. °· Return to your normal activities as told by your health care provider.  Ask your health care provider what activities are safe. °General instructions °· To prevent or treat constipation while you are taking prescription pain medicine, your health care provider may recommend that you: °? Drink enough fluid to keep your urine clear or pale yellow. °? Take over-the-counter or prescription medicines. °? Eat foods that are high in fiber, such as fresh fruits and vegetables, whole grains, and beans. °? Limit foods that are high in fat and processed sugars, such as fried and sweet foods. °· Take over-the-counter and prescription medicines only as told by your health care provider. °· Do not take tub baths or go swimming until your health care provider approves. °· Keep all follow-up visits as told by your health care provider. This is important. °Contact a health care provider if: °· You develop a rash. °· You have more redness, swelling, or pain around your incision. °· You have more fluid or blood coming from your incision. °· Your incision feels warm to the touch. °· You have pus or a bad smell coming from your incision. °· You have a fever or chills. °· You have blood in your stool (feces). °· You have not had a bowel movement in 2-3 days. °· Your pain is not controlled with medicine. °Get help right away if: °· You have chest pain or shortness of breath. °· You feel light-headed or feel faint. °· You have severe pain. °· You vomit and your pain is worse. °This information is not intended to replace advice given to you by your health care provider. Make sure you discuss any questions you have with your health care provider. °Document Released: 11/02/2004 Document Revised: 11/03/2015 Document Reviewed: 09/27/2015 °Elsevier Interactive Patient Education © 2018 Elsevier Inc. ° °

## 2018-01-23 NOTE — Anesthesia Preprocedure Evaluation (Signed)
Anesthesia Evaluation  Patient identified by MRN, date of birth, ID band Patient awake    Reviewed: Allergy & Precautions, H&P , NPO status , Patient's Chart, lab work & pertinent test results, reviewed documented beta blocker date and time   Airway Mallampati: II  TM Distance: >3 FB Neck ROM: full    Dental no notable dental hx. (+) Teeth Intact, Dental Advidsory Given   Pulmonary neg pulmonary ROS, Current Smoker,    Pulmonary exam normal breath sounds clear to auscultation       Cardiovascular Exercise Tolerance: Good negative cardio ROS   Rhythm:regular Rate:Normal     Neuro/Psych negative neurological ROS  negative psych ROS   GI/Hepatic negative GI ROS, Neg liver ROS,   Endo/Other  negative endocrine ROS  Renal/GU negative Renal ROS  negative genitourinary   Musculoskeletal   Abdominal   Peds  Hematology negative hematology ROS (+)   Anesthesia Other Findings NSR 78 with incomplete LBBB 20-plus years RA on methotrexate/FA  Reproductive/Obstetrics negative OB ROS                             Anesthesia Physical Anesthesia Plan  ASA: II  Anesthesia Plan: General   Post-op Pain Management:    Induction:   PONV Risk Score and Plan:   Airway Management Planned:   Additional Equipment:   Intra-op Plan:   Post-operative Plan:   Informed Consent: I have reviewed the patients History and Physical, chart, labs and discussed the procedure including the risks, benefits and alternatives for the proposed anesthesia with the patient or authorized representative who has indicated his/her understanding and acceptance.   Dental Advisory Given  Plan Discussed with: CRNA and Anesthesiologist  Anesthesia Plan Comments:         Anesthesia Quick Evaluation

## 2018-01-23 NOTE — Transfer of Care (Signed)
Immediate Anesthesia Transfer of Care Note  Patient: Richard Manning  Procedure(s) Performed: HERNIA REPAIR INGUINAL ADULT WITH MESH (Right ) HERNIA REPAIR FEMORAL with mesh (Right Inguinal)  Patient Location: PACU  Anesthesia Type:General  Level of Consciousness: awake and alert   Airway & Oxygen Therapy: Patient Spontanous Breathing  Post-op Assessment: Report given to RN  Post vital signs: Reviewed and stable  Last Vitals:  Vitals Value Taken Time  BP 153/88 01/23/2018  1:46 PM  Temp    Pulse 55 01/23/2018  1:48 PM  Resp 7 01/23/2018  1:48 PM  SpO2 98 % 01/23/2018  1:48 PM  Vitals shown include unvalidated device data.  Last Pain:  Vitals:   01/23/18 0931  TempSrc: Oral  PainSc: 0-No pain      Patients Stated Pain Goal: 2 (01/23/18 0931)  Complications: No apparent anesthesia complications

## 2018-01-23 NOTE — Interval H&P Note (Signed)
History and Physical Interval Note:  01/23/2018 9:40 AM  Richard Manning  has presented today for surgery, with the diagnosis of right inguinal hernia  The various methods of treatment have been discussed with the patient and family. After consideration of risks, benefits and other options for treatment, the patient has consented to  Procedure(s): HERNIA REPAIR INGUINAL ADULT WITH MESH (Right) as a surgical intervention .  The patient's history has been reviewed, patient examined, no change in status, stable for surgery.  I have reviewed the patient's chart and labs.  Questions were answered to the patient's satisfaction.     [name]

## 2018-01-23 NOTE — Anesthesia Procedure Notes (Signed)
Procedure Name: LMA Insertion Date/Time: 01/23/2018 12:29 PM Performed by: Moshe Salisbury, CRNA Pre-anesthesia Checklist: Patient identified, Patient being monitored, Emergency Drugs available, Timeout performed and Suction available Patient Re-evaluated:Patient Re-evaluated prior to induction Oxygen Delivery Method: Circle System Utilized Preoxygenation: Pre-oxygenation with 100% oxygen Induction Type: IV induction Ventilation: Mask ventilation without difficulty LMA: LMA inserted LMA Size: 4.0 Number of attempts: 1 Placement Confirmation: positive ETCO2 and breath sounds checked- equal and bilateral

## 2018-01-23 NOTE — Op Note (Signed)
Patient:  Richard Manning  DOB:  July 06, 1955  MRN:  [id number]   Preop Diagnosis: Right inguinal hernia  Postop Diagnosis: Same, right femoral hernia  Procedure: Right inguinal herniorrhaphy with mesh, right femoral herniorrhaphy with mesh  Surgeon: [name], MD  Anes: General tracheal  Indications: Patient is a 62 year old white male who presents with a symptomatic right inguinal hernia.  The risks and benefits of the procedure including bleeding, infection, mesh use, and the possibility of recurrence of the hernia were fully explained to the patient, who gave informed consent.  Procedure note: The patient was placed in the supine position.  After induction of general endotracheal anesthesia, the right groin region was prepped and draped using the usual sterile technique with Technicare.  Surgical site confirmation was performed.  An incision was made in the right groin region down to the external oblique aponeurosis.  The aponeurosis was incised to the external ring.  A Penrose drain was placed around the spermatic cord.  The vase deferens was noted within the spermatic cord.  The ileal inguinal nerve was identified.  On inspection, the patient had both a direct inguinal hernia as well as a reducible right femoral hernia.  The right femoral hernia was easily reduced and a piece of rolled up Marlex mesh was placed in the right femoral canal and secured to the aponeurosis using a 2-0 Prolene interrupted suture.  The direct hernia was incised at its base and inverted.  A medium sized Bard PerFix plug was then inserted and secured circumferentially to the transversalis fascia using 2-0 Prolene interrupted sutures.  An onlay patch was then placed along the floor of the inguinal canal and secured superiorly to the conjoined tendon and inferiorly to the shelving edge of Poupart's ligament using 2-0 Prolene interrupted sutures.  The internal ring was re-created using a 2-0 Prolene interrupted  suture.  The external oblique aponeurosis was reapproximated using a 2-0 Vicryl running suture.  The subcutaneous layer was reapproximated using 3-0 Vicryl interrupted suture.  Exparel was instilled into the surrounding wound.  The skin was closed using a 4-0 Monocryl subcuticular suture.  Dermabond was applied.  All tape and needle counts were correct at the end of the procedures.  The patient was extubated in the operating room and transferred to PACU in stable condition.  Complications: None  EBL: Minimal  Specimen: None

## 2018-01-23 NOTE — Anesthesia Postprocedure Evaluation (Signed)
Anesthesia Post Note  Patient: Richard Manning  Procedure(s) Performed: HERNIA REPAIR INGUINAL ADULT WITH MESH (Right ) HERNIA REPAIR FEMORAL with mesh (Right Inguinal)  Patient location during evaluation: Short Stay Anesthesia Type: General Level of consciousness: awake and alert and patient cooperative Pain management: satisfactory to patient Vital Signs Assessment: post-procedure vital signs reviewed and stable Respiratory status: spontaneous breathing Cardiovascular status: stable Postop Assessment: no apparent nausea or vomiting Anesthetic complications: no Comments: Late entry     Last Vitals:  Vitals:   01/23/18 1415 01/23/18 1423  BP: (!) 143/96 (!) 155/81  Pulse: (!) 50 (!) 51  Resp: 14   Temp:    SpO2: 99% 99%    Last Pain:  Vitals:   01/23/18 1423  TempSrc:   PainSc: 2                  Annaleigh Steinmeyer

## 2018-01-26 ENCOUNTER — Encounter (HOSPITAL_COMMUNITY): Payer: Self-pay | Admitting: General Surgery

## 2018-01-29 ENCOUNTER — Other Ambulatory Visit: Payer: Self-pay | Admitting: General Surgery

## 2018-01-29 DIAGNOSIS — Z09 Encounter for follow-up examination after completed treatment for conditions other than malignant neoplasm: Secondary | ICD-10-CM | POA: Insufficient documentation

## 2018-01-29 MED ORDER — OXYCODONE-ACETAMINOPHEN 7.5-325 MG PO TABS: 1.0000 | ORAL_TABLET | Freq: Four times a day (QID) | ORAL | 0 refills | Status: DC | PRN

## 2018-02-05 ENCOUNTER — Ambulatory Visit (INDEPENDENT_AMBULATORY_CARE_PROVIDER_SITE_OTHER): Payer: Self-pay | Admitting: General Surgery

## 2018-02-05 ENCOUNTER — Encounter: Payer: Self-pay | Admitting: General Surgery

## 2018-02-05 VITALS — BP 130/70 | HR 56 | Temp 98.4°F | Resp 16 | Wt 123.0 lb

## 2018-02-05 DIAGNOSIS — Z09 Encounter for follow-up examination after completed treatment for conditions other than malignant neoplasm: Secondary | ICD-10-CM

## 2018-02-05 NOTE — Progress Notes (Signed)
Subjective:     Richard Manning  Status post right inguinal herniorrhaphy with mesh, femoral herniorrhaphy with mesh.  Patient is doing well.  He is pleased with the results.  Minimal incisional pain noted. Objective:    BP 130/70 (BP Location: Left Arm, Patient Position: Sitting, Cuff Size: Normal)   Pulse (!) 56   Temp 98.4 F (36.9 C) (Temporal)   Resp 16   Wt 123 lb (55.8 kg)   BMI 18.70 kg/m   General:  alert, cooperative and no distress  Right groin incision well-healed.  As he is so skinny, I do feel a small knot in the right femoral region that is the mesh that was placed.  Patient understands this.  It is nontender.     Assessment:    Doing well postoperatively.    Plan:   May gradually resume normal activity.  Follow-up here as needed.

## 2018-02-12 ENCOUNTER — Other Ambulatory Visit: Payer: Self-pay | Admitting: Rheumatology

## 2018-02-12 MED FILL — METHOTREXATE 2.5 MG TABLET: 2.5 | 84 days supply | Qty: 120 | Fill #0

## 2018-02-12 NOTE — Telephone Encounter (Signed)
Last visit: 01/01/18 Next Visit: 06/04/18 Labs: 01/01/18 CBC stable. CMP WNL.  Okay to refill per Dr. Corliss Skains

## 2018-02-24 ENCOUNTER — Ambulatory Visit: Payer: Medicare Other | Admitting: Physician Assistant

## 2018-04-03 ENCOUNTER — Other Ambulatory Visit: Payer: Self-pay

## 2018-04-03 DIAGNOSIS — Z79899 Other long term (current) drug therapy: Secondary | ICD-10-CM | POA: Diagnosis not present

## 2018-04-03 DIAGNOSIS — E559 Vitamin D deficiency, unspecified: Secondary | ICD-10-CM | POA: Diagnosis not present

## 2018-04-04 LAB — COMPLETE METABOLIC PANEL WITH GFR
AG RATIO: 1.2 (calc) (ref 1.0–2.5)
ALT: 23 U/L (ref 9–46)
AST: 20 U/L (ref 10–35)
Albumin: 3.8 g/dL (ref 3.6–5.1)
Alkaline phosphatase (APISO): 88 U/L (ref 40–115)
BUN: 10 mg/dL (ref 7–25)
CO2: 23 mmol/L (ref 20–32)
CREATININE: 0.81 mg/dL (ref 0.70–1.25)
Calcium: 9.2 mg/dL (ref 8.6–10.3)
Chloride: 104 mmol/L (ref 98–110)
GFR, EST AFRICAN AMERICAN: 112 mL/min/{1.73_m2} (ref >=60)
GFR, Est Non African American: 97 mL/min/{1.73_m2} (ref >=60)
GLOBULIN: 3.1 g/dL (ref 1.9–3.7)
Glucose, Bld: 98 mg/dL (ref 65–99)
POTASSIUM: 4.1 mmol/L (ref 3.5–5.3)
Sodium: 137 mmol/L (ref 135–146)
Total Bilirubin: 0.5 mg/dL (ref 0.2–1.2)
Total Protein: 6.9 g/dL (ref 6.1–8.1)

## 2018-04-04 LAB — CBC WITH DIFFERENTIAL/PLATELET
BASOS PCT: 1.2 %
Basophils Absolute: 61 cells/uL (ref 0–200)
Eosinophils Absolute: 82 cells/uL (ref 15–500)
Eosinophils Relative: 1.6 %
HCT: 43.8 % (ref 38.5–50.0)
HEMOGLOBIN: 15.5 g/dL (ref 13.2–17.1)
Lymphs Abs: 1612 cells/uL (ref 850–3900)
MCH: 34.8 pg — ABNORMAL HIGH (ref 27.0–33.0)
MCHC: 35.4 g/dL (ref 32.0–36.0)
MCV: 98.4 fL (ref 80.0–100.0)
MPV: 9.6 fL (ref 7.5–12.5)
Monocytes Relative: 10.5 %
NEUTROS ABS: 2810 {cells}/uL (ref 1500–7800)
Neutrophils Relative %: 55.1 %
PLATELETS: 244 10*3/uL (ref 140–400)
RBC: 4.45 10*6/uL (ref 4.20–5.80)
RDW: 13.9 % (ref 11.0–15.0)
TOTAL LYMPHOCYTE: 31.6 %
WBC mixed population: 536 cells/uL (ref 200–950)
WBC: 5.1 10*3/uL (ref 3.8–10.8)

## 2018-04-04 LAB — VITAMIN D 25 HYDROXY (VIT D DEFICIENCY, FRACTURES): VIT D 25 HYDROXY: 26 ng/mL — AB (ref 30–100)

## 2018-04-06 ENCOUNTER — Other Ambulatory Visit: Payer: Self-pay

## 2018-04-06 DIAGNOSIS — E559 Vitamin D deficiency, unspecified: Secondary | ICD-10-CM

## 2018-04-06 MED ORDER — VITAMIN D (ERGOCALCIFEROL) 1.25 MG (50000 UNIT) PO CAPS: 50000.0000 [IU] | ORAL_CAPSULE | ORAL | 0 refills | Status: DC

## 2018-04-06 MED FILL — predniSONE 10 MG TABS: 10 | 30 days supply | Qty: 30 | Fill #2

## 2018-04-06 MED FILL — VIT D2 1.25 MG (50,000 UNIT: 1.25 MG | 84 days supply | Qty: 12 | Fill #0

## 2018-04-06 MED FILL — FOLIC ACID 1 MG TABS: 1 | 90 days supply | Qty: 180 | Fill #2

## 2018-04-06 NOTE — Progress Notes (Signed)
Sed rate is mildly elevated but stable.  All the labs are normal.

## 2018-04-06 NOTE — Progress Notes (Signed)
Vitamin D is low.  Please send in vitamin D 50,000 units by mouth once weekly for 3 months.  We will recheck vitamin D in 3 months.

## 2018-05-07 ENCOUNTER — Other Ambulatory Visit: Payer: Self-pay | Admitting: Rheumatology

## 2018-05-07 MED FILL — METHOTREXATE 2.5 MG TABLET: 2.5 | 84 days supply | Qty: 120 | Fill #0

## 2018-05-07 NOTE — Telephone Encounter (Signed)
Last visit: 01/01/18 Next Visit: 06/04/18 Labs: 04/03/18 cbc/cmp wnl  Okay to refill per Dr. Corliss Skains

## 2018-05-21 NOTE — Progress Notes (Signed)
Office Visit Note  Patient: Richard Manning             Date of Birth: 03/11/1956           MRN: [id number]             PCP: Benita Stabile, MD Referring: Benita Stabile, MD Visit Date: 06/04/2018 Occupation: @GUAROCC @  Subjective:  Morning stiffness   History of Present Illness: Richard Manning is a 63 y.o. male with history of seropositive rheumatoid arthritis and osteoarthritis.  He is current taking MTX 5 tablets on Fridays and 5 tablets on Saturdays. he denies missing any doses. He denies any recent flares.  He states he has not felt this good since he was diagnosed years ago.  He has not taken prednisone since Summer 2019.  He denies any joint pain at this time.  He has morning stiffness lasting about 2 hours in both hands.  He reports the swelling in his hands has improved significantly.  He is able to do all ADLs.   He continues to take vitamin D 50,000 units by mouth once weekly.  He has 3 more doses, and is planning on returning for lab work once he completes the course.  He denies any recent infections.   Activities of Daily Living:  Patient reports morning stiffness for 2 hours.   Patient Denies nocturnal pain.  Difficulty dressing/grooming: Denies Difficulty climbing stairs: Denies Difficulty getting out of chair: Denies Difficulty using hands for taps, buttons, cutlery, and/or writing: Denies  Review of Systems  Constitutional: Positive for fatigue. Negative for night sweats.  HENT: Negative for mouth sores, mouth dryness and nose dryness.   Eyes: Negative for redness, visual disturbance and dryness.  Respiratory: Negative for cough, hemoptysis, shortness of breath and difficulty breathing.   Cardiovascular: Negative for chest pain, palpitations, hypertension, irregular heartbeat and swelling in legs/feet.  Gastrointestinal: Negative for blood in stool, constipation and diarrhea.  Endocrine: Negative for increased urination.  Genitourinary: Negative for painful  urination.  Musculoskeletal: Positive for joint swelling and morning stiffness. Negative for arthralgias, joint pain, myalgias, muscle weakness, muscle tenderness and myalgias.  Skin: Negative for color change, rash, hair loss, nodules/bumps, skin tightness, ulcers and sensitivity to sunlight.  Allergic/Immunologic: Negative for susceptible to infections.  Neurological: Negative for dizziness, fainting, memory loss, night sweats and weakness.  Hematological: Negative for swollen glands.  Psychiatric/Behavioral: Positive for sleep disturbance. Negative for depressed mood. The patient is not nervous/anxious.     PMFS History:  Patient Active Problem List   Diagnosis Date Noted  . Follow-up examination following surgery 01/29/2018  . Right inguinal hernia   . Femoral hernia of right side     Past Medical History:  Diagnosis Date  . RA (rheumatoid arthritis) (HCC)     Family History  Problem Relation Age of Onset  . Atrial fibrillation Mother   . Cancer Mother        breast    Past Surgical History:  Procedure Laterality Date  . FEMORAL HERNIA REPAIR Right 01/23/2018   Procedure: HERNIA REPAIR FEMORAL with mesh;  Surgeon: [name], MD;  Location: [facility];  Service: General;  Laterality: Right;  . INGUINAL HERNIA REPAIR Right 01/23/2018   Procedure: HERNIA REPAIR INGUINAL ADULT WITH MESH;  Surgeon: [name], MD;  Location: [facility];  Service: General;  Laterality: Right;   Social History   Social History Narrative  . Not on file    There is no immunization history  on file for this patient.   Objective: Vital Signs: BP 137/89 (BP Location: Left Arm, Patient Position: Sitting, Cuff Size: Normal)   Pulse 66   Resp 12   Ht 5\' 8"  (1.727 m)   Wt 126 lb 12.8 oz (57.5 kg)   BMI 19.28 kg/m    Physical Exam Vitals signs and nursing note reviewed.  Constitutional:      Appearance: He is well-developed.  HENT:     Head: Normocephalic and atraumatic.  Eyes:      Conjunctiva/sclera: Conjunctivae normal.     Pupils: Pupils are equal, round, and reactive to light.  Neck:     Musculoskeletal: Normal range of motion and neck supple.  Cardiovascular:     Rate and Rhythm: Normal rate and regular rhythm.     Heart sounds: Normal heart sounds.  Pulmonary:     Effort: Pulmonary effort is normal.     Breath sounds: Normal breath sounds.  Abdominal:     General: Bowel sounds are normal.     Palpations: Abdomen is soft.  Lymphadenopathy:     Cervical: No cervical adenopathy.  Skin:    General: Skin is warm and dry.     Capillary Refill: Capillary refill takes less than 2 seconds.  Neurological:     Mental Status: He is alert and oriented to person, place, and time.  Psychiatric:        Behavior: Behavior normal.      Musculoskeletal Exam: C-spine limited ROM.  Thoracic and lumbar spine good ROM.  No midline spinal tenderness.  No SI joint tenderness.  Shoulder joints, elbow joints, wrist joints, MCPs, PIPs, and DIPs good ROM.  He has complete fist formation bilaterally.  Hip joints, knee joints, ankle joints, MTPs, PIPs, and DIPs good ROM with no synovitis.  No warmth or effusion of knee joints.  No tenderness or swelling of ankle joints.  No tenderness over trochanteric bursa bilaterally.    CDAI Exam: CDAI Score: 2.6  Patient Global Assessment: 3 (mm); Provider Global Assessment: 3 (mm) Swollen: 2 ; Tender: 0  Joint Exam      Right  Left  MCP 2  Swollen      MCP 3  Swollen       There is currently no information documented on the homunculus. Go to the Rheumatology activity and complete the homunculus joint exam.  Investigation: No additional findings.  Imaging: No results found.  Recent Labs: Lab Results  Component Value Date   WBC 5.1 04/03/2018   HGB 15.5 04/03/2018   PLT 244 04/03/2018   NA 137 04/03/2018   K 4.1 04/03/2018   CL 104 04/03/2018   CO2 23 04/03/2018   GLUCOSE 98 04/03/2018   BUN 10 04/03/2018   CREATININE 0.81  04/03/2018   BILITOT 0.5 04/03/2018   AST 20 04/03/2018   ALT 23 04/03/2018   PROT 6.9 04/03/2018   CALCIUM 9.2 04/03/2018   GFRAA 112 04/03/2018   QFTBGOLDPLUS NEGATIVE 05/28/2017    Speciality Comments: No specialty comments available.  Procedures:  No procedures performed Allergies: Benadryl [diphenhydramine] and Peanuts [peanut oil]   Assessment / Plan:     Visit Diagnoses: Rheumatoid arthritis involving multiple sites with positive rheumatoid factor (HCC): He has mild synovitis of the right second and third MCP joints.  He has no tenderness on exam.  He is able to make a complete fist.  Rheumatoid nodules on his fingers have started to resolve.  He has not had any flares  recently.  He has not needed to take prednisone since summer 2019.  He has not felt this great since he was initially diagnosed.  He is able to perform all daily activities.  He has been taking methotrexate 5 tablets by mouth on Fridays and 5 tablets by mouth on Saturdays.  He is only been taking folic acid 1 mg by mouth daily.  He was advised to increase to 2 mg of folic acid daily.  He does not need any refills of his medications at this time.  He will continue on his current treatment regimen.  He was advised to notify us if he develops increased joint pain or joint swelling.  He will follow-up in the office in 5 months.  High risk medication use - MTX 5 tablets on Fridays and 5 tablets on Saturdays, d/c SSZ 500 mg 2 tablets BID due to GI intolerance.  CBC and CMP were within normal limits on 04/03/2018.  He will return for lab work in March and every 3 months to monitor for drug toxicity.  He has not had any recent infections.  He was advised to hold his dose of methotrexate anytime he has an infection.  Rheumatoid nodulosis [facility](HCC): Resolving   Primary osteoarthritis of right knee: No warmth or effusion.  Good range of motion with no discomfort.  Vitamin D deficiency: He has been taking vitamin D 50,000 units by mouth  once weekly.  He has 3 doses left.  We will check vitamin D with next lab work. Future order is in place.   Smoker: We discussed the importance of tobacco cessation.  He is going to try to quit prior to his next appointment.  Orders: No orders of the defined types were placed in this encounter.  No orders of the defined types were placed in this encounter.    Follow-Up Instructions: Return in about 5 months (around 11/02/2018) for Rheumatoid arthritis, Osteoarthritis.   Gearldine Bienenstockaylor M Taheerah Guldin, PA-C  Note - This record has been created using Dragon software.  Chart creation errors have been sought, but may not always  have been located. Such creation errors do not reflect on  the standard of medical care.

## 2018-06-04 ENCOUNTER — Encounter: Payer: Self-pay | Admitting: Physician Assistant

## 2018-06-04 ENCOUNTER — Ambulatory Visit (INDEPENDENT_AMBULATORY_CARE_PROVIDER_SITE_OTHER): Payer: Medicare Other | Admitting: Physician Assistant

## 2018-06-04 VITALS — BP 137/89 | HR 66 | Resp 12 | Ht 68.0 in | Wt 126.8 lb

## 2018-06-04 DIAGNOSIS — E559 Vitamin D deficiency, unspecified: Secondary | ICD-10-CM

## 2018-06-04 DIAGNOSIS — M0630 Rheumatoid nodule, unspecified site: Secondary | ICD-10-CM

## 2018-06-04 DIAGNOSIS — M0579 Rheumatoid arthritis with rheumatoid factor of multiple sites without organ or systems involvement: Secondary | ICD-10-CM | POA: Diagnosis not present

## 2018-06-04 DIAGNOSIS — Z79899 Other long term (current) drug therapy: Secondary | ICD-10-CM

## 2018-06-04 DIAGNOSIS — F17200 Nicotine dependence, unspecified, uncomplicated: Secondary | ICD-10-CM

## 2018-06-04 DIAGNOSIS — M1711 Unilateral primary osteoarthritis, right knee: Secondary | ICD-10-CM | POA: Diagnosis not present

## 2018-06-04 NOTE — Patient Instructions (Signed)
Standing Labs We placed an order today for your standing lab work.    Please come back and get your standing labs in March and every 3 months  We have open lab Monday through Friday from 8:30-11:30 AM and 1:30-4:00 PM  at the office of Dr. Shaili Deveshwar.   You may experience shorter wait times on Monday and Friday afternoons. The office is located at [address], [address], [place], [place] [address] No appointment is necessary.   Labs are drawn by [facility].  You may receive a bill from [facility] for your lab work.  If you wish to have your labs drawn at another location, please call the office 24 hours in advance to send orders.  If you have any questions regarding directions or hours of operation,  please call [phone].   Just as a reminder please drink plenty of water prior to coming for your lab work. Thanks!  

## 2018-07-02 ENCOUNTER — Other Ambulatory Visit: Payer: Self-pay

## 2018-07-02 DIAGNOSIS — E559 Vitamin D deficiency, unspecified: Secondary | ICD-10-CM

## 2018-07-02 DIAGNOSIS — Z79899 Other long term (current) drug therapy: Secondary | ICD-10-CM

## 2018-07-03 LAB — CBC WITH DIFFERENTIAL/PLATELET
Absolute Monocytes: 348 cells/uL (ref 200–950)
BASOS ABS: 51 {cells}/uL (ref 0–200)
Basophils Relative: 0.9 %
Eosinophils Absolute: 91 cells/uL (ref 15–500)
Eosinophils Relative: 1.6 %
HCT: 48.9 % (ref 38.5–50.0)
Hemoglobin: 16.6 g/dL (ref 13.2–17.1)
Lymphs Abs: 1573 cells/uL (ref 850–3900)
MCH: 34 pg — AB (ref 27.0–33.0)
MCHC: 33.9 g/dL (ref 32.0–36.0)
MCV: 100.2 fL — ABNORMAL HIGH (ref 80.0–100.0)
MPV: 9.5 fL (ref 7.5–12.5)
Monocytes Relative: 6.1 %
Neutro Abs: 3637 cells/uL (ref 1500–7800)
Neutrophils Relative %: 63.8 %
Platelets: 269 10*3/uL (ref 140–400)
RBC: 4.88 10*6/uL (ref 4.20–5.80)
RDW: 13 % (ref 11.0–15.0)
Total Lymphocyte: 27.6 %
WBC: 5.7 10*3/uL (ref 3.8–10.8)

## 2018-07-03 LAB — COMPLETE METABOLIC PANEL WITH GFR
AG Ratio: 1.2 (calc) (ref 1.0–2.5)
ALT: 11 U/L (ref 9–46)
AST: 16 U/L (ref 10–35)
Albumin: 4 g/dL (ref 3.6–5.1)
Alkaline phosphatase (APISO): 90 U/L (ref 35–144)
BUN: 14 mg/dL (ref 7–25)
CO2: 28 mmol/L (ref 20–32)
Calcium: 9.5 mg/dL (ref 8.6–10.3)
Chloride: 105 mmol/L (ref 98–110)
Creat: 0.82 mg/dL (ref 0.70–1.25)
GFR, EST AFRICAN AMERICAN: 111 mL/min/{1.73_m2} (ref >=60)
GFR, Est Non African American: 96 mL/min/{1.73_m2} (ref >=60)
Globulin: 3.4 g/dL (calc) (ref 1.9–3.7)
Glucose, Bld: 94 mg/dL (ref 65–99)
Potassium: 4.5 mmol/L (ref 3.5–5.3)
Sodium: 139 mmol/L (ref 135–146)
TOTAL PROTEIN: 7.4 g/dL (ref 6.1–8.1)
Total Bilirubin: 0.6 mg/dL (ref 0.2–1.2)

## 2018-07-03 LAB — VITAMIN D 25 HYDROXY (VIT D DEFICIENCY, FRACTURES): Vit D, 25-Hydroxy: 47 ng/mL (ref 30–100)

## 2018-07-03 NOTE — Progress Notes (Signed)
WNLs

## 2018-07-30 ENCOUNTER — Other Ambulatory Visit: Payer: Self-pay | Admitting: Rheumatology

## 2018-07-30 MED FILL — METHOTREXATE SODIUM 2.5 MG: 2.5 | 84 days supply | Qty: 120 | Fill #0

## 2018-07-30 MED FILL — FOLIC ACID 1 MG TABLET: 1 | 90 days supply | Qty: 180 | Fill #0

## 2018-07-30 NOTE — Telephone Encounter (Signed)
Last Visit: 06/04/18 Next visit: 11/02/18 Labs: 07/02/18 WNL  Okay to refill per Dr. Corliss Skains.

## 2018-10-19 NOTE — Progress Notes (Signed)
Office Visit Note  Patient: Richard Manning             Date of Birth: 12/15/1955           MRN: [id number]             PCP: Celene Squibb, MD Referring: Celene Squibb, MD Visit Date: 11/02/2018 Occupation: @GUAROCC @  Subjective:  Medication monitoring   History of Present Illness: Richard Manning is a 63 y.o. male with history of seropositive rheumatoid arthritis.  Patient is taking methotrexate 5 tablets by mouth on Fridays and 5 tablets by mouth on Saturdays.  He denies any recent rheumatoid arthritis flares.  He has not missed any doses of MTX recently. He has not needed any prednisone recently. He denies any recent infections.  He continues to try to social distance and stay home as much as he can during the covid-19 pandemic.  He reports his joint pain and swelling improves in the summer with warmer weather.  He denies any joint pain or joint swelling at this time.  He is able to make a complete fist bilaterally.    Activities of Daily Living:  Patient reports morning stiffness for 0 minutes.   Patient Denies nocturnal pain.  Difficulty dressing/grooming: Denies Difficulty climbing stairs: Denies Difficulty getting out of chair: Denies Difficulty using hands for taps, buttons, cutlery, and/or writing: Denies  Review of Systems  Constitutional: Positive for fatigue. Negative for night sweats.  HENT: Negative for mouth sores, mouth dryness and nose dryness.   Eyes: Negative for redness, itching, visual disturbance and dryness.  Respiratory: Negative for cough, hemoptysis, shortness of breath and difficulty breathing.   Cardiovascular: Negative for chest pain, palpitations, hypertension, irregular heartbeat and swelling in legs/feet.  Gastrointestinal: Negative for blood in stool, constipation and diarrhea.  Endocrine: Negative for increased urination.  Genitourinary: Negative for painful urination and pelvic pain.  Musculoskeletal: Positive for arthralgias and joint pain.  Negative for joint swelling, myalgias, muscle weakness, morning stiffness, muscle tenderness and myalgias.  Skin: Negative for color change, rash, hair loss, nodules/bumps, redness, skin tightness, ulcers and sensitivity to sunlight.  Allergic/Immunologic: Negative for susceptible to infections.  Neurological: Negative for dizziness, fainting, light-headedness, headaches, memory loss, night sweats and weakness.  Hematological: Negative for bruising/bleeding tendency and swollen glands.  Psychiatric/Behavioral: Positive for sleep disturbance. Negative for depressed mood and confusion. The patient is not nervous/anxious.     PMFS History:  Patient Active Problem List   Diagnosis Date Noted  . Follow-up examination following surgery 01/29/2018  . Right inguinal hernia   . Femoral hernia of right side     Past Medical History:  Diagnosis Date  . RA (rheumatoid arthritis) (HCC)     Family History  Problem Relation Age of Onset  . Atrial fibrillation Mother   . Cancer Mother        breast    Past Surgical History:  Procedure Laterality Date  . FEMORAL HERNIA REPAIR Right 01/23/2018   Procedure: HERNIA REPAIR FEMORAL with mesh;  Surgeon: Aviva Signs, MD;  Location: [facility];  Service: General;  Laterality: Right;  . INGUINAL HERNIA REPAIR Right 01/23/2018   Procedure: HERNIA REPAIR INGUINAL ADULT WITH MESH;  Surgeon: Aviva Signs, MD;  Location: [facility];  Service: General;  Laterality: Right;   Social History   Social History Narrative  . Not on file    There is no immunization history on file for this patient.   Objective: Vital Signs: BP Marland Kitchen)  146/74 (BP Location: Left Arm, Patient Position: Sitting, Cuff Size: Normal)   Pulse 69   Resp 13   Ht 5\' 8"  (1.727 m)   Wt 124 lb 12.8 oz (56.6 kg)   BMI 18.98 kg/m    Physical Exam Vitals signs and nursing note reviewed.  Constitutional:      Appearance: He is well-developed.  HENT:     Head: Normocephalic and atraumatic.  Eyes:      Conjunctiva/sclera: Conjunctivae normal.     Pupils: Pupils are equal, round, and reactive to light.  Neck:     Musculoskeletal: Normal range of motion and neck supple.  Cardiovascular:     Rate and Rhythm: Normal rate and regular rhythm.     Heart sounds: Normal heart sounds.  Pulmonary:     Effort: Pulmonary effort is normal.     Breath sounds: Normal breath sounds.  Abdominal:     General: Bowel sounds are normal.     Palpations: Abdomen is soft.  Lymphadenopathy:     Cervical: No cervical adenopathy.  Skin:    General: Skin is warm and dry.     Capillary Refill: Capillary refill takes less than 2 seconds.  Neurological:     Mental Status: He is alert and oriented to person, place, and time.  Psychiatric:        Behavior: Behavior normal.      Musculoskeletal Exam: C-spine, thoracic spine, and lumbar spine good ROM.  No midline spinal tenderness.  No SI joint tenderness.  Shoulder joints, elbow joints, wrist joints, MCPs, PIPs, and DIPs good ROM with no synovitis. MCP synovial thickening but no synovitis noted. Nodulosis in both hands.  Ulnar deviation bilaterally. PIP and DIP synovial thickening.  Hip joints, knee joints, ankle joints, MTPs, PIPs, and DIPs good ROM with no synovitis.  No warmth or effusion of knee joints.  No tenderness or swelling of ankle joints.    CDAI Exam: CDAI Score: 0.8  Patient Global: 4 mm; Provider Global: 4 mm Swollen: 0 ; Tender: 0  Joint Exam   No joint exam has been documented for this visit   There is currently no information documented on the homunculus. Go to the Rheumatology activity and complete the homunculus joint exam.  Investigation: No additional findings.  Imaging: No results found.  Recent Labs: Lab Results  Component Value Date   WBC 5.7 07/02/2018   HGB 16.6 07/02/2018   PLT 269 07/02/2018   NA 139 07/02/2018   K 4.5 07/02/2018   CL 105 07/02/2018   CO2 28 07/02/2018   GLUCOSE 94 07/02/2018   BUN 14 07/02/2018    CREATININE 0.82 07/02/2018   BILITOT 0.6 07/02/2018   AST 16 07/02/2018   ALT 11 07/02/2018   PROT 7.4 07/02/2018   CALCIUM 9.5 07/02/2018   GFRAA 111 07/02/2018   QFTBGOLDPLUS NEGATIVE 05/28/2017    Speciality Comments: No specialty comments available.  Procedures:  No procedures performed Allergies: Benadryl [diphenhydramine] and Peanuts [peanut oil]   Assessment / Plan:     Visit Diagnoses: Rheumatoid arthritis involving multiple sites with positive rheumatoid factor (HCC) - He has no synovitis on exam.  He has not had any recent rheumatoid arthritis flares.  He has no joint pain or joint swelling at this time.  He has no morning stiffness. He has synovial thickening of MCP joints but no tenderness or synovitis. Ulnar deviation and nodulosis noted bilaterally. He is able to make a complete fist bilaterally.  He is clinically  doing well on Methotrexate 5 tablets by mouth on Friday and 5 tablets on Saturdays.  He has not missed any doses recently, and he has not required prednisone.  He does not need any refills at this time.  He was advised to notify us if he develops increased joint pain or joint swelling.  He will follow up in 5 months.   Rheumatoid nodulosis (HCC) - Bilateral hands-Chronic, stable.   High risk medication use -Methotrexate 2.5 mg 5 tablets on Friday and 5 tablets on Saturday only, folic acid 1 mg 2 tablets daily, and prednisone 10 mg 1 tablet daily.  Most recent CBC/CMP within normal limits on 07/02/2018.  Due for CBC/CMP today and will monitor every 3 months.  Standing orders are in place.   d/c SSZ 500 mg 2 tablets BID due to GI intolerance - Plan: CBC with Differential/Platelet, COMPLETE METABOLIC PANEL WITH GFR  Primary osteoarthritis of right knee - He has no right knee joint pain or joint swelling at this time.  No warmth or effusion noted.  He has good ROM with no discomfort.   Smoker   Vitamin D deficiency - He is taking OTC vitamin D.  On prednisone therapy  - 10 mg prn -He has not required prednisone recently.   Orders: Orders Placed This Encounter  Procedures  . CBC with Differential/Platelet  . COMPLETE METABOLIC PANEL WITH GFR   No orders of the defined types were placed in this encounter.    Follow-Up Instructions: Return for Rheumatoid arthritis.   Gearldine Bienenstock, PA-C   I examined and evaluated the patient with Sherron Ales PA. The plan of care was discussed as noted above.  Pollyann Savoy, MD  Note - This record has been created using [employer].  Chart creation errors have been sought, but may not always  have been located. Such creation errors do not reflect on  the standard of medical care.

## 2018-10-26 ENCOUNTER — Telehealth: Payer: Self-pay | Admitting: Rheumatology

## 2018-10-26 MED ORDER — METHOTREXATE 2.5 MG PO TABS: ORAL_TABLET | ORAL | 0 refills | Status: DC

## 2018-10-26 NOTE — Telephone Encounter (Signed)
Last Visit: 06/04/18 Next visit: 11/02/18 Labs: 07/02/18 WNL  Left message to advise patient he is due for labs.   Okay to refill 30 day supply per Dr. Estanislado Pandy.

## 2018-10-26 NOTE — Addendum Note (Signed)
Addended by: Carole Binning on: 10/26/2018 11:27 AM   Modules accepted: Orders

## 2018-10-26 NOTE — Telephone Encounter (Signed)
Patient called requesting prescription refill of Methotrexate to be sent to new pharmacy - [facility] in [place].  Patient states he will be out of medication on Friday, 10/30/18.

## 2018-10-26 NOTE — Addendum Note (Signed)
Addended by: Carole Binning on: 10/26/2018 01:29 PM   Modules accepted: Orders

## 2018-11-02 ENCOUNTER — Other Ambulatory Visit: Payer: Self-pay

## 2018-11-02 ENCOUNTER — Encounter: Payer: Self-pay | Admitting: Physician Assistant

## 2018-11-02 ENCOUNTER — Ambulatory Visit (INDEPENDENT_AMBULATORY_CARE_PROVIDER_SITE_OTHER): Payer: Medicare Other | Admitting: Physician Assistant

## 2018-11-02 VITALS — BP 146/74 | HR 69 | Resp 13 | Ht 68.0 in | Wt 124.8 lb

## 2018-11-02 DIAGNOSIS — M0579 Rheumatoid arthritis with rheumatoid factor of multiple sites without organ or systems involvement: Secondary | ICD-10-CM | POA: Diagnosis not present

## 2018-11-02 DIAGNOSIS — F17200 Nicotine dependence, unspecified, uncomplicated: Secondary | ICD-10-CM | POA: Diagnosis not present

## 2018-11-02 DIAGNOSIS — M1711 Unilateral primary osteoarthritis, right knee: Secondary | ICD-10-CM

## 2018-11-02 DIAGNOSIS — M0630 Rheumatoid nodule, unspecified site: Secondary | ICD-10-CM

## 2018-11-02 DIAGNOSIS — E559 Vitamin D deficiency, unspecified: Secondary | ICD-10-CM | POA: Diagnosis not present

## 2018-11-02 DIAGNOSIS — Z79899 Other long term (current) drug therapy: Secondary | ICD-10-CM | POA: Diagnosis not present

## 2018-11-02 DIAGNOSIS — Z7952 Long term (current) use of systemic steroids: Secondary | ICD-10-CM

## 2018-11-03 LAB — CBC WITH DIFFERENTIAL/PLATELET
Absolute Monocytes: 221 cells/uL (ref 200–950)
Basophils Absolute: 19 cells/uL (ref 0–200)
Basophils Relative: 0.3 %
Eosinophils Absolute: 50 cells/uL (ref 15–500)
Eosinophils Relative: 0.8 %
HCT: 44 % (ref 38.5–50.0)
Hemoglobin: 14.8 g/dL (ref 13.2–17.1)
Lymphs Abs: 1443 cells/uL (ref 850–3900)
MCH: 34.1 pg — ABNORMAL HIGH (ref 27.0–33.0)
MCHC: 33.6 g/dL (ref 32.0–36.0)
MCV: 101.4 fL — ABNORMAL HIGH (ref 80.0–100.0)
MPV: 9.4 fL (ref 7.5–12.5)
Monocytes Relative: 3.5 %
Neutro Abs: 4568 cells/uL (ref 1500–7800)
Neutrophils Relative %: 72.5 %
Platelets: 239 10*3/uL (ref 140–400)
RBC: 4.34 10*6/uL (ref 4.20–5.80)
RDW: 13.1 % (ref 11.0–15.0)
Total Lymphocyte: 22.9 %
WBC: 6.3 10*3/uL (ref 3.8–10.8)

## 2018-11-03 LAB — COMPLETE METABOLIC PANEL WITH GFR
AG Ratio: 1.2 (calc) (ref 1.0–2.5)
ALT: 12 U/L (ref 9–46)
AST: 19 U/L (ref 10–35)
Albumin: 3.8 g/dL (ref 3.6–5.1)
Alkaline phosphatase (APISO): 97 U/L (ref 35–144)
BUN: 9 mg/dL (ref 7–25)
CO2: 25 mmol/L (ref 20–32)
Calcium: 9.2 mg/dL (ref 8.6–10.3)
Chloride: 106 mmol/L (ref 98–110)
Creat: 0.9 mg/dL (ref 0.70–1.25)
GFR, Est African American: 107 mL/min/{1.73_m2} (ref >=60)
GFR, Est Non African American: 93 mL/min/{1.73_m2} (ref >=60)
Globulin: 3.2 g/dL (calc) (ref 1.9–3.7)
Glucose, Bld: 89 mg/dL (ref 65–99)
Potassium: 4.1 mmol/L (ref 3.5–5.3)
Sodium: 140 mmol/L (ref 135–146)
Total Bilirubin: 0.7 mg/dL (ref 0.2–1.2)
Total Protein: 7 g/dL (ref 6.1–8.1)

## 2018-11-03 NOTE — Progress Notes (Signed)
MCV and MCH are elevated.  Please make sure he is taking folic acid 2 mg po daily.  Rest of CBC WNL.  CMP WNL.

## 2018-11-25 ENCOUNTER — Other Ambulatory Visit: Payer: Self-pay | Admitting: Rheumatology

## 2018-11-25 NOTE — Telephone Encounter (Signed)
Last Visit: 11/02/18 Next Visit: 04/05/19 Labs: 11/02/18 MCV and MCH are elevated. Rest of CBC WNL. CMP WNL.   Okay to refill per Dr. Estanislado Pandy

## 2018-12-11 DIAGNOSIS — Z79899 Other long term (current) drug therapy: Secondary | ICD-10-CM | POA: Diagnosis not present

## 2018-12-11 DIAGNOSIS — Z0000 Encounter for general adult medical examination without abnormal findings: Secondary | ICD-10-CM | POA: Diagnosis not present

## 2018-12-22 DIAGNOSIS — Z0001 Encounter for general adult medical examination with abnormal findings: Secondary | ICD-10-CM | POA: Diagnosis not present

## 2018-12-22 DIAGNOSIS — F17210 Nicotine dependence, cigarettes, uncomplicated: Secondary | ICD-10-CM | POA: Diagnosis not present

## 2018-12-22 DIAGNOSIS — Z681 Body mass index (BMI) 19 or less, adult: Secondary | ICD-10-CM | POA: Diagnosis not present

## 2018-12-22 DIAGNOSIS — K4090 Unilateral inguinal hernia, without obstruction or gangrene, not specified as recurrent: Secondary | ICD-10-CM | POA: Diagnosis not present

## 2018-12-22 DIAGNOSIS — M06049 Rheumatoid arthritis without rheumatoid factor, unspecified hand: Secondary | ICD-10-CM | POA: Diagnosis not present

## 2019-01-06 DIAGNOSIS — Z1211 Encounter for screening for malignant neoplasm of colon: Secondary | ICD-10-CM | POA: Diagnosis not present

## 2019-01-06 DIAGNOSIS — Z1212 Encounter for screening for malignant neoplasm of rectum: Secondary | ICD-10-CM | POA: Diagnosis not present

## 2019-02-17 ENCOUNTER — Other Ambulatory Visit: Payer: Self-pay | Admitting: Rheumatology

## 2019-02-17 NOTE — Telephone Encounter (Signed)
Last Visit: 11/02/18 Next Visit: 04/05/19 Labs: 11/02/18 MCV and MCH are elevated. Rest of CBC WNL. CMP WNL.   Patient advised he is due to update labs. Patient states he will come 02/18/19.  Okay to refill per Dr. Estanislado Pandy

## 2019-02-18 ENCOUNTER — Other Ambulatory Visit: Payer: Self-pay | Admitting: *Deleted

## 2019-02-18 DIAGNOSIS — M0579 Rheumatoid arthritis with rheumatoid factor of multiple sites without organ or systems involvement: Secondary | ICD-10-CM | POA: Diagnosis not present

## 2019-02-19 LAB — COMPLETE METABOLIC PANEL WITH GFR
AG Ratio: 1.2 (calc) (ref 1.0–2.5)
ALT: 12 U/L (ref 9–46)
AST: 21 U/L (ref 10–35)
Albumin: 3.6 g/dL (ref 3.6–5.1)
Alkaline phosphatase (APISO): 83 U/L (ref 35–144)
BUN: 10 mg/dL (ref 7–25)
CO2: 26 mmol/L (ref 20–32)
Calcium: 9.5 mg/dL (ref 8.6–10.3)
Chloride: 105 mmol/L (ref 98–110)
Creat: 1 mg/dL (ref 0.70–1.25)
GFR, Est African American: 94 mL/min/{1.73_m2} (ref >=60)
GFR, Est Non African American: 81 mL/min/{1.73_m2} (ref >=60)
Globulin: 3 g/dL (calc) (ref 1.9–3.7)
Glucose, Bld: 96 mg/dL (ref 65–99)
Potassium: 4.4 mmol/L (ref 3.5–5.3)
Sodium: 140 mmol/L (ref 135–146)
Total Bilirubin: 0.5 mg/dL (ref 0.2–1.2)
Total Protein: 6.6 g/dL (ref 6.1–8.1)

## 2019-02-19 LAB — CBC WITH DIFFERENTIAL/PLATELET
Absolute Monocytes: 733 cells/uL (ref 200–950)
Basophils Absolute: 59 cells/uL (ref 0–200)
Basophils Relative: 0.8 %
Eosinophils Absolute: 74 cells/uL (ref 15–500)
Eosinophils Relative: 1 %
HCT: 42.4 % (ref 38.5–50.0)
Hemoglobin: 14.8 g/dL (ref 13.2–17.1)
Lymphs Abs: 1458 cells/uL (ref 850–3900)
MCH: 36.1 pg — ABNORMAL HIGH (ref 27.0–33.0)
MCHC: 34.9 g/dL (ref 32.0–36.0)
MCV: 103.4 fL — ABNORMAL HIGH (ref 80.0–100.0)
MPV: 9.7 fL (ref 7.5–12.5)
Monocytes Relative: 9.9 %
Neutro Abs: 5076 cells/uL (ref 1500–7800)
Neutrophils Relative %: 68.6 %
Platelets: 266 10*3/uL (ref 140–400)
RBC: 4.1 10*6/uL — ABNORMAL LOW (ref 4.20–5.80)
RDW: 13.4 % (ref 11.0–15.0)
Total Lymphocyte: 19.7 %
WBC: 7.4 10*3/uL (ref 3.8–10.8)

## 2019-02-19 NOTE — Progress Notes (Signed)
MCV is high.  Please make sure the patient takes his folic acid on regular basis.

## 2019-03-18 ENCOUNTER — Other Ambulatory Visit: Payer: Self-pay | Admitting: Rheumatology

## 2019-03-18 NOTE — Telephone Encounter (Signed)
Last Visit: 11/02/18 Next Visit: 04/05/19 Labs: 02/18/19 MCV is high.   Okay to refill per Dr. Estanislado Pandy

## 2019-04-05 ENCOUNTER — Ambulatory Visit: Payer: Medicare Other | Admitting: Rheumatology

## 2019-08-20 DIAGNOSIS — Z23 Encounter for immunization: Secondary | ICD-10-CM | POA: Diagnosis not present

## 2019-09-07 DIAGNOSIS — Z23 Encounter for immunization: Secondary | ICD-10-CM | POA: Diagnosis not present

## 2019-12-30 DIAGNOSIS — F17210 Nicotine dependence, cigarettes, uncomplicated: Secondary | ICD-10-CM | POA: Diagnosis not present

## 2019-12-30 DIAGNOSIS — Z0001 Encounter for general adult medical examination with abnormal findings: Secondary | ICD-10-CM | POA: Diagnosis not present

## 2019-12-30 DIAGNOSIS — Z716 Tobacco abuse counseling: Secondary | ICD-10-CM | POA: Diagnosis not present

## 2019-12-30 DIAGNOSIS — K4090 Unilateral inguinal hernia, without obstruction or gangrene, not specified as recurrent: Secondary | ICD-10-CM | POA: Diagnosis not present

## 2019-12-30 DIAGNOSIS — M06049 Rheumatoid arthritis without rheumatoid factor, unspecified hand: Secondary | ICD-10-CM | POA: Diagnosis not present

## 2019-12-30 DIAGNOSIS — Z681 Body mass index (BMI) 19 or less, adult: Secondary | ICD-10-CM | POA: Diagnosis not present

## 2020-01-04 DIAGNOSIS — F17210 Nicotine dependence, cigarettes, uncomplicated: Secondary | ICD-10-CM | POA: Diagnosis not present

## 2020-01-04 DIAGNOSIS — Z0001 Encounter for general adult medical examination with abnormal findings: Secondary | ICD-10-CM | POA: Diagnosis not present

## 2020-01-04 DIAGNOSIS — M06049 Rheumatoid arthritis without rheumatoid factor, unspecified hand: Secondary | ICD-10-CM | POA: Diagnosis not present

## 2020-01-04 DIAGNOSIS — Z716 Tobacco abuse counseling: Secondary | ICD-10-CM | POA: Diagnosis not present

## 2020-01-04 DIAGNOSIS — Z0000 Encounter for general adult medical examination without abnormal findings: Secondary | ICD-10-CM | POA: Diagnosis not present

## 2020-01-04 DIAGNOSIS — Z681 Body mass index (BMI) 19 or less, adult: Secondary | ICD-10-CM | POA: Diagnosis not present

## 2020-01-04 DIAGNOSIS — Z720 Tobacco use: Secondary | ICD-10-CM | POA: Diagnosis not present

## 2020-01-04 DIAGNOSIS — K4090 Unilateral inguinal hernia, without obstruction or gangrene, not specified as recurrent: Secondary | ICD-10-CM | POA: Diagnosis not present

## 2020-01-04 DIAGNOSIS — Z0189 Encounter for other specified special examinations: Secondary | ICD-10-CM | POA: Diagnosis not present

## 2020-03-14 DIAGNOSIS — Z23 Encounter for immunization: Secondary | ICD-10-CM | POA: Diagnosis not present

## 2020-07-07 DIAGNOSIS — Z712 Person consulting for explanation of examination or test findings: Secondary | ICD-10-CM | POA: Diagnosis not present

## 2020-07-07 DIAGNOSIS — Z681 Body mass index (BMI) 19 or less, adult: Secondary | ICD-10-CM | POA: Diagnosis not present

## 2020-07-07 DIAGNOSIS — Z0189 Encounter for other specified special examinations: Secondary | ICD-10-CM | POA: Diagnosis not present

## 2020-07-07 DIAGNOSIS — Z0000 Encounter for general adult medical examination without abnormal findings: Secondary | ICD-10-CM | POA: Diagnosis not present

## 2020-07-07 DIAGNOSIS — Z0001 Encounter for general adult medical examination with abnormal findings: Secondary | ICD-10-CM | POA: Diagnosis not present

## 2020-07-12 DIAGNOSIS — F17210 Nicotine dependence, cigarettes, uncomplicated: Secondary | ICD-10-CM | POA: Diagnosis not present

## 2020-07-12 DIAGNOSIS — Z681 Body mass index (BMI) 19 or less, adult: Secondary | ICD-10-CM | POA: Diagnosis not present

## 2020-07-12 DIAGNOSIS — Z716 Tobacco abuse counseling: Secondary | ICD-10-CM | POA: Diagnosis not present

## 2020-07-12 DIAGNOSIS — M06049 Rheumatoid arthritis without rheumatoid factor, unspecified hand: Secondary | ICD-10-CM | POA: Diagnosis not present

## 2020-07-12 DIAGNOSIS — K4090 Unilateral inguinal hernia, without obstruction or gangrene, not specified as recurrent: Secondary | ICD-10-CM | POA: Diagnosis not present

## 2020-08-11 DIAGNOSIS — Z23 Encounter for immunization: Secondary | ICD-10-CM | POA: Diagnosis not present

## 2021-01-08 DIAGNOSIS — Z125 Encounter for screening for malignant neoplasm of prostate: Secondary | ICD-10-CM | POA: Diagnosis not present

## 2021-01-08 DIAGNOSIS — Z0000 Encounter for general adult medical examination without abnormal findings: Secondary | ICD-10-CM | POA: Diagnosis not present

## 2021-01-08 DIAGNOSIS — Z79899 Other long term (current) drug therapy: Secondary | ICD-10-CM | POA: Diagnosis not present

## 2021-01-11 DIAGNOSIS — F17200 Nicotine dependence, unspecified, uncomplicated: Secondary | ICD-10-CM | POA: Diagnosis not present

## 2021-01-11 DIAGNOSIS — K4090 Unilateral inguinal hernia, without obstruction or gangrene, not specified as recurrent: Secondary | ICD-10-CM | POA: Diagnosis not present

## 2021-01-11 DIAGNOSIS — E559 Vitamin D deficiency, unspecified: Secondary | ICD-10-CM | POA: Diagnosis not present

## 2021-01-11 DIAGNOSIS — Z0001 Encounter for general adult medical examination with abnormal findings: Secondary | ICD-10-CM | POA: Diagnosis not present

## 2021-01-11 DIAGNOSIS — M069 Rheumatoid arthritis, unspecified: Secondary | ICD-10-CM | POA: Diagnosis not present

## 2021-03-18 DIAGNOSIS — Z23 Encounter for immunization: Secondary | ICD-10-CM | POA: Diagnosis not present

## 2021-07-09 DIAGNOSIS — M069 Rheumatoid arthritis, unspecified: Secondary | ICD-10-CM | POA: Diagnosis not present

## 2021-07-09 DIAGNOSIS — E559 Vitamin D deficiency, unspecified: Secondary | ICD-10-CM | POA: Diagnosis not present

## 2021-07-12 DIAGNOSIS — F17200 Nicotine dependence, unspecified, uncomplicated: Secondary | ICD-10-CM | POA: Diagnosis not present

## 2021-07-12 DIAGNOSIS — K4090 Unilateral inguinal hernia, without obstruction or gangrene, not specified as recurrent: Secondary | ICD-10-CM | POA: Diagnosis not present

## 2021-07-12 DIAGNOSIS — M069 Rheumatoid arthritis, unspecified: Secondary | ICD-10-CM | POA: Diagnosis not present

## 2021-07-12 DIAGNOSIS — E559 Vitamin D deficiency, unspecified: Secondary | ICD-10-CM | POA: Diagnosis not present

## 2021-07-12 DIAGNOSIS — D7589 Other specified diseases of blood and blood-forming organs: Secondary | ICD-10-CM | POA: Diagnosis not present

## 2022-01-07 DIAGNOSIS — E559 Vitamin D deficiency, unspecified: Secondary | ICD-10-CM | POA: Diagnosis not present

## 2022-01-07 DIAGNOSIS — E782 Mixed hyperlipidemia: Secondary | ICD-10-CM | POA: Diagnosis not present

## 2022-01-07 DIAGNOSIS — M069 Rheumatoid arthritis, unspecified: Secondary | ICD-10-CM | POA: Diagnosis not present

## 2022-01-14 DIAGNOSIS — K4090 Unilateral inguinal hernia, without obstruction or gangrene, not specified as recurrent: Secondary | ICD-10-CM | POA: Diagnosis not present

## 2022-01-14 DIAGNOSIS — F17200 Nicotine dependence, unspecified, uncomplicated: Secondary | ICD-10-CM | POA: Diagnosis not present

## 2022-01-14 DIAGNOSIS — Z634 Disappearance and death of family member: Secondary | ICD-10-CM | POA: Diagnosis not present

## 2022-01-14 DIAGNOSIS — M069 Rheumatoid arthritis, unspecified: Secondary | ICD-10-CM | POA: Diagnosis not present

## 2022-01-14 DIAGNOSIS — Z125 Encounter for screening for malignant neoplasm of prostate: Secondary | ICD-10-CM | POA: Diagnosis not present

## 2022-01-14 DIAGNOSIS — D7589 Other specified diseases of blood and blood-forming organs: Secondary | ICD-10-CM | POA: Diagnosis not present

## 2022-01-14 DIAGNOSIS — Z681 Body mass index (BMI) 19 or less, adult: Secondary | ICD-10-CM | POA: Diagnosis not present

## 2022-01-14 DIAGNOSIS — Z1211 Encounter for screening for malignant neoplasm of colon: Secondary | ICD-10-CM | POA: Diagnosis not present

## 2022-01-14 DIAGNOSIS — E559 Vitamin D deficiency, unspecified: Secondary | ICD-10-CM | POA: Diagnosis not present

## 2022-01-14 DIAGNOSIS — Z0000 Encounter for general adult medical examination without abnormal findings: Secondary | ICD-10-CM | POA: Diagnosis not present

## 2022-01-25 DIAGNOSIS — Z1211 Encounter for screening for malignant neoplasm of colon: Secondary | ICD-10-CM | POA: Diagnosis not present

## 2022-02-01 LAB — EXTERNAL GENERIC LAB PROCEDURE: COLOGUARD: NEGATIVE

## 2022-02-01 LAB — COLOGUARD: COLOGUARD: NEGATIVE

## 2022-07-09 DIAGNOSIS — E559 Vitamin D deficiency, unspecified: Secondary | ICD-10-CM | POA: Diagnosis not present

## 2022-07-09 DIAGNOSIS — Z125 Encounter for screening for malignant neoplasm of prostate: Secondary | ICD-10-CM | POA: Diagnosis not present

## 2022-07-09 DIAGNOSIS — E782 Mixed hyperlipidemia: Secondary | ICD-10-CM | POA: Diagnosis not present

## 2022-07-09 DIAGNOSIS — M069 Rheumatoid arthritis, unspecified: Secondary | ICD-10-CM | POA: Diagnosis not present

## 2022-07-16 ENCOUNTER — Encounter (HOSPITAL_COMMUNITY): Payer: Self-pay | Admitting: Physician Assistant

## 2022-07-16 ENCOUNTER — Encounter (HOSPITAL_COMMUNITY): Payer: Self-pay | Admitting: Internal Medicine

## 2022-07-16 ENCOUNTER — Other Ambulatory Visit (HOSPITAL_COMMUNITY): Payer: Self-pay | Admitting: Internal Medicine

## 2022-07-16 DIAGNOSIS — F17209 Nicotine dependence, unspecified, with unspecified nicotine-induced disorders: Secondary | ICD-10-CM

## 2022-07-16 DIAGNOSIS — Z87891 Personal history of nicotine dependence: Secondary | ICD-10-CM

## 2022-07-16 DIAGNOSIS — F17200 Nicotine dependence, unspecified, uncomplicated: Secondary | ICD-10-CM

## 2022-08-15 ENCOUNTER — Ambulatory Visit (HOSPITAL_COMMUNITY)
Admission: RE | Admit: 2022-08-15 | Discharge: 2022-08-15 | Disposition: A | Discharge status: Home / Self Care | Payer: Medicare Other | Source: Ambulatory Visit | Attending: Internal Medicine | Admitting: Internal Medicine

## 2022-08-15 DIAGNOSIS — Z87891 Personal history of nicotine dependence: Secondary | ICD-10-CM | POA: Diagnosis not present

## 2022-08-15 DIAGNOSIS — F17210 Nicotine dependence, cigarettes, uncomplicated: Secondary | ICD-10-CM | POA: Diagnosis not present

## 2023-01-09 DIAGNOSIS — M069 Rheumatoid arthritis, unspecified: Secondary | ICD-10-CM | POA: Diagnosis not present

## 2023-01-09 DIAGNOSIS — E559 Vitamin D deficiency, unspecified: Secondary | ICD-10-CM | POA: Diagnosis not present

## 2023-01-15 DIAGNOSIS — D7589 Other specified diseases of blood and blood-forming organs: Secondary | ICD-10-CM | POA: Diagnosis not present

## 2023-01-15 DIAGNOSIS — Z0001 Encounter for general adult medical examination with abnormal findings: Secondary | ICD-10-CM | POA: Diagnosis not present

## 2023-01-15 DIAGNOSIS — E559 Vitamin D deficiency, unspecified: Secondary | ICD-10-CM | POA: Diagnosis not present

## 2023-01-15 DIAGNOSIS — F17210 Nicotine dependence, cigarettes, uncomplicated: Secondary | ICD-10-CM | POA: Diagnosis not present

## 2023-01-15 DIAGNOSIS — F17200 Nicotine dependence, unspecified, uncomplicated: Secondary | ICD-10-CM | POA: Diagnosis not present

## 2023-01-15 DIAGNOSIS — I700 Atherosclerosis of aorta: Secondary | ICD-10-CM | POA: Diagnosis not present

## 2023-01-15 DIAGNOSIS — I2510 Atherosclerotic heart disease of native coronary artery without angina pectoris: Secondary | ICD-10-CM | POA: Diagnosis not present

## 2023-01-15 DIAGNOSIS — M069 Rheumatoid arthritis, unspecified: Secondary | ICD-10-CM | POA: Diagnosis not present

## 2023-01-15 DIAGNOSIS — K4090 Unilateral inguinal hernia, without obstruction or gangrene, not specified as recurrent: Secondary | ICD-10-CM | POA: Diagnosis not present

## 2023-01-15 DIAGNOSIS — Z79899 Other long term (current) drug therapy: Secondary | ICD-10-CM | POA: Diagnosis not present

## 2023-04-24 DIAGNOSIS — M069 Rheumatoid arthritis, unspecified: Secondary | ICD-10-CM | POA: Diagnosis not present

## 2023-04-24 DIAGNOSIS — E559 Vitamin D deficiency, unspecified: Secondary | ICD-10-CM | POA: Diagnosis not present

## 2023-04-29 DIAGNOSIS — D7589 Other specified diseases of blood and blood-forming organs: Secondary | ICD-10-CM | POA: Diagnosis not present

## 2023-04-29 DIAGNOSIS — I700 Atherosclerosis of aorta: Secondary | ICD-10-CM | POA: Diagnosis not present

## 2023-04-29 DIAGNOSIS — I2510 Atherosclerotic heart disease of native coronary artery without angina pectoris: Secondary | ICD-10-CM | POA: Diagnosis not present

## 2023-04-29 DIAGNOSIS — F17200 Nicotine dependence, unspecified, uncomplicated: Secondary | ICD-10-CM | POA: Diagnosis not present

## 2023-04-29 DIAGNOSIS — Z681 Body mass index (BMI) 19 or less, adult: Secondary | ICD-10-CM | POA: Diagnosis not present

## 2023-04-29 DIAGNOSIS — Z79899 Other long term (current) drug therapy: Secondary | ICD-10-CM | POA: Diagnosis not present

## 2023-04-29 DIAGNOSIS — F17210 Nicotine dependence, cigarettes, uncomplicated: Secondary | ICD-10-CM | POA: Diagnosis not present

## 2023-04-29 DIAGNOSIS — E559 Vitamin D deficiency, unspecified: Secondary | ICD-10-CM | POA: Diagnosis not present

## 2023-04-29 DIAGNOSIS — K4090 Unilateral inguinal hernia, without obstruction or gangrene, not specified as recurrent: Secondary | ICD-10-CM | POA: Diagnosis not present

## 2023-04-29 DIAGNOSIS — M069 Rheumatoid arthritis, unspecified: Secondary | ICD-10-CM | POA: Diagnosis not present

## 2023-06-24 ENCOUNTER — Encounter (HOSPITAL_COMMUNITY): Payer: Self-pay | Admitting: *Deleted

## 2023-06-24 ENCOUNTER — Other Ambulatory Visit: Payer: Self-pay

## 2023-06-24 ENCOUNTER — Emergency Department (HOSPITAL_COMMUNITY): Payer: Medicare Other

## 2023-06-24 ENCOUNTER — Observation Stay (HOSPITAL_COMMUNITY): Payer: Medicare Other

## 2023-06-24 ENCOUNTER — Observation Stay (HOSPITAL_COMMUNITY)
Admission: EM | Admit: 2023-06-24 | Discharge: 2023-06-25 | Disposition: A | Discharge status: Home / Self Care | Payer: Medicare Other | Attending: Family Medicine | Admitting: Family Medicine

## 2023-06-24 DIAGNOSIS — R200 Anesthesia of skin: Principal | ICD-10-CM

## 2023-06-24 DIAGNOSIS — Z91010 Allergy to peanuts: Secondary | ICD-10-CM | POA: Insufficient documentation

## 2023-06-24 DIAGNOSIS — I639 Cerebral infarction, unspecified: Principal | ICD-10-CM

## 2023-06-24 DIAGNOSIS — Z79899 Other long term (current) drug therapy: Secondary | ICD-10-CM | POA: Insufficient documentation

## 2023-06-24 DIAGNOSIS — Z7902 Long term (current) use of antithrombotics/antiplatelets: Secondary | ICD-10-CM | POA: Insufficient documentation

## 2023-06-24 DIAGNOSIS — Z7982 Long term (current) use of aspirin: Secondary | ICD-10-CM | POA: Insufficient documentation

## 2023-06-24 DIAGNOSIS — R202 Paresthesia of skin: Secondary | ICD-10-CM | POA: Diagnosis not present

## 2023-06-24 DIAGNOSIS — R531 Weakness: Secondary | ICD-10-CM | POA: Diagnosis not present

## 2023-06-24 DIAGNOSIS — D751 Secondary polycythemia: Secondary | ICD-10-CM | POA: Diagnosis not present

## 2023-06-24 DIAGNOSIS — R519 Headache, unspecified: Secondary | ICD-10-CM | POA: Diagnosis not present

## 2023-06-24 DIAGNOSIS — I10 Essential (primary) hypertension: Secondary | ICD-10-CM | POA: Diagnosis not present

## 2023-06-24 DIAGNOSIS — R42 Dizziness and giddiness: Secondary | ICD-10-CM | POA: Diagnosis not present

## 2023-06-24 DIAGNOSIS — R030 Elevated blood-pressure reading, without diagnosis of hypertension: Secondary | ICD-10-CM | POA: Diagnosis not present

## 2023-06-24 DIAGNOSIS — M069 Rheumatoid arthritis, unspecified: Secondary | ICD-10-CM | POA: Insufficient documentation

## 2023-06-24 DIAGNOSIS — F17210 Nicotine dependence, cigarettes, uncomplicated: Secondary | ICD-10-CM | POA: Diagnosis not present

## 2023-06-24 DIAGNOSIS — I6389 Other cerebral infarction: Secondary | ICD-10-CM | POA: Diagnosis not present

## 2023-06-24 DIAGNOSIS — I672 Cerebral atherosclerosis: Secondary | ICD-10-CM | POA: Diagnosis not present

## 2023-06-24 LAB — URINALYSIS, ROUTINE W REFLEX MICROSCOPIC
Bilirubin Urine: NEGATIVE
Glucose, UA: NEGATIVE mg/dL
Hgb urine dipstick: NEGATIVE
Ketones, ur: NEGATIVE mg/dL
Leukocytes,Ua: NEGATIVE
Nitrite: NEGATIVE
Protein, ur: NEGATIVE mg/dL
Specific Gravity, Urine: 1.024 (ref 1.005–1.030)
pH: 7 (ref 5.0–8.0)

## 2023-06-24 LAB — CBC WITH DIFFERENTIAL/PLATELET
Abs Immature Granulocytes: 0.02 10*3/uL (ref 0.00–0.07)
Basophils Absolute: 0 10*3/uL (ref 0.0–0.1)
Basophils Relative: 0 %
Eosinophils Absolute: 0 10*3/uL (ref 0.0–0.5)
Eosinophils Relative: 1 %
HCT: 49.3 % (ref 39.0–52.0)
Hemoglobin: 17.1 g/dL — ABNORMAL HIGH (ref 13.0–17.0)
Immature Granulocytes: 0 %
Lymphocytes Relative: 20 %
Lymphs Abs: 1.2 10*3/uL (ref 0.7–4.0)
MCH: 34.7 pg — ABNORMAL HIGH (ref 26.0–34.0)
MCHC: 34.7 g/dL (ref 30.0–36.0)
MCV: 100 fL (ref 80.0–100.0)
Monocytes Absolute: 0.1 10*3/uL (ref 0.1–1.0)
Monocytes Relative: 2 %
Neutro Abs: 4.4 10*3/uL (ref 1.7–7.7)
Neutrophils Relative %: 77 %
Platelets: 193 10*3/uL (ref 150–400)
RBC: 4.93 MIL/uL (ref 4.22–5.81)
RDW: 13.8 % (ref 11.5–15.5)
WBC: 5.8 10*3/uL (ref 4.0–10.5)
nRBC: 0 % (ref 0.0–0.2)

## 2023-06-24 LAB — RAPID URINE DRUG SCREEN, HOSP PERFORMED
Amphetamines: NOT DETECTED
Barbiturates: NOT DETECTED
Benzodiazepines: NOT DETECTED
Cocaine: NOT DETECTED
Opiates: NOT DETECTED
Tetrahydrocannabinol: POSITIVE — AB

## 2023-06-24 LAB — COMPREHENSIVE METABOLIC PANEL
ALT: 25 U/L (ref 0–44)
AST: 32 U/L (ref 15–41)
Albumin: 4.2 g/dL (ref 3.5–5.0)
Alkaline Phosphatase: 89 U/L (ref 38–126)
Anion gap: 9 (ref 5–15)
BUN: 11 mg/dL (ref 8–23)
CO2: 23 mmol/L (ref 22–32)
Calcium: 9.2 mg/dL (ref 8.9–10.3)
Chloride: 104 mmol/L (ref 98–111)
Creatinine, Ser: 0.8 mg/dL (ref 0.61–1.24)
GFR, Estimated: >60 mL/min (ref >60)
Glucose, Bld: 122 mg/dL — ABNORMAL HIGH (ref 70–99)
Potassium: 3.6 mmol/L (ref 3.5–5.1)
Sodium: 136 mmol/L (ref 135–145)
Total Bilirubin: 1.2 mg/dL (ref 0.0–1.2)
Total Protein: 7.9 g/dL (ref 6.5–8.1)

## 2023-06-24 LAB — I-STAT CHEM 8, ED
BUN: 10 mg/dL (ref 8–23)
Calcium, Ion: 1.17 mmol/L (ref 1.15–1.40)
Chloride: 105 mmol/L (ref 98–111)
Creatinine, Ser: 0.9 mg/dL (ref 0.61–1.24)
Glucose, Bld: 123 mg/dL — ABNORMAL HIGH (ref 70–99)
HCT: 54 % — ABNORMAL HIGH (ref 39.0–52.0)
Hemoglobin: 18.4 g/dL — ABNORMAL HIGH (ref 13.0–17.0)
Potassium: 3.8 mmol/L (ref 3.5–5.1)
Sodium: 140 mmol/L (ref 135–145)
TCO2: 24 mmol/L (ref 22–32)

## 2023-06-24 LAB — MAGNESIUM: Magnesium: 2.2 mg/dL (ref 1.7–2.4)

## 2023-06-24 LAB — ETHANOL: Alcohol, Ethyl (B): <10 mg/dL (ref <10)

## 2023-06-24 LAB — TSH: TSH: 1.248 u[IU]/mL (ref 0.350–4.500)

## 2023-06-24 LAB — LDL CHOLESTEROL, DIRECT: Direct LDL: 58 mg/dL (ref 0–99)

## 2023-06-24 LAB — HIV ANTIBODY (ROUTINE TESTING W REFLEX): HIV Screen 4th Generation wRfx: REACTIVE — AB

## 2023-06-24 MED ORDER — CLOPIDOGREL BISULFATE 75 MG PO TABS
75.0000 mg | ORAL_TABLET | Freq: Every day | ORAL | Status: DC
  Administered 2023-06-24 – 2023-06-25 (×2): 75 mg via ORAL
  Filled 2023-06-24 (×2): qty 1

## 2023-06-24 MED ORDER — ASPIRIN 325 MG PO TABS
325.0000 mg | ORAL_TABLET | Freq: Once | ORAL | Status: AC
  Administered 2023-06-24: 325 mg via ORAL
  Filled 2023-06-24: qty 1

## 2023-06-24 MED ORDER — ASPIRIN 81 MG PO CHEW
81.0000 mg | CHEWABLE_TABLET | Freq: Every day | ORAL | Status: DC
  Administered 2023-06-25: 81 mg via ORAL
  Filled 2023-06-24: qty 1

## 2023-06-24 MED ORDER — HYDRALAZINE HCL 20 MG/ML IJ SOLN: 10.0000 mg | Freq: Four times a day (QID) | INTRAMUSCULAR | Status: DC | PRN

## 2023-06-24 MED ORDER — NICOTINE 14 MG/24HR TD PT24
14.0000 mg | MEDICATED_PATCH | Freq: Every day | TRANSDERMAL | Status: DC
Start: 2023-06-24 — End: 2023-06-25
  Administered 2023-06-24 – 2023-06-25 (×2): 14 mg via TRANSDERMAL
  Filled 2023-06-24 (×2): qty 1

## 2023-06-24 MED ORDER — ACETAMINOPHEN 325 MG PO TABS: 650.0000 mg | ORAL_TABLET | Freq: Four times a day (QID) | ORAL | Status: DC | PRN

## 2023-06-24 MED ORDER — LABETALOL HCL 5 MG/ML IV SOLN
10.0000 mg | INTRAVENOUS | Status: DC | PRN
Start: 2023-06-24 — End: 2023-06-25

## 2023-06-24 MED ORDER — AMLODIPINE BESYLATE 5 MG PO TABS
5.0000 mg | ORAL_TABLET | Freq: Every day | ORAL | Status: DC
  Administered 2023-06-24 – 2023-06-25 (×2): 5 mg via ORAL
  Filled 2023-06-24 (×2): qty 1

## 2023-06-24 MED ORDER — IOHEXOL 350 MG/ML SOLN
75.0000 mL | Freq: Once | INTRAVENOUS | Status: AC | PRN
  Administered 2023-06-24: 75 mL via INTRAVENOUS

## 2023-06-24 NOTE — ED Triage Notes (Signed)
 Pt c/o numbness feeling to right side of his body  Pt states he woke up at 0830 yesterday morning and he went to make his coffee and while it was brewing he sat down to watch news, he states he then got up to go check on his coffee and noticed at 9 that something was not right with his equilibrium and he was feeling numb  Pt states he did not come in thinking it would go away but pt still having numbness to right side of his body and a slight headache

## 2023-06-24 NOTE — Consult Note (Addendum)
 I connected with  Richard Manning on 06/24/23 by a video enabled telemedicine application and verified that I am speaking with the correct person using two identifiers.   I discussed the limitations of evaluation and management by telemedicine. The patient expressed understanding and agreed to proceed.  Location of patient: [facility] Location of physician: [facility]   Neurology Consultation Reason for Consult: stroke Referring Physician: Dr Audelia Hives  CC: right sided numbness and dysequilibrium  History is obtained from: patient, chart review  HPI: Richard Manning is a 68 y.o. male  with PMH of RA on methotrexate and prednisone, HLD who presented with right sided face, arm and leg numbness as well as leaning towards the right side. States he was drinking his coffee yesterday morning around 9am when symptoms started.   LKN: 9 am [id number] Event happened at home No tpa as outside window No thrombectomy as no LVO mRS 0   ROS: All other systems reviewed and negative except as noted in the HPI.   Past Medical History:  Diagnosis Date   RA (rheumatoid arthritis) (HCC)      Family History  Problem Relation Age of Onset   Atrial fibrillation Mother    Cancer Mother        breast     Social History:  reports that he has been smoking cigarettes. He has a 10 pack-year smoking history. He has never used smokeless tobacco. He reports that he does not drink alcohol and does not use drugs.  Exam: Current vital signs: BP (!) 167/98   Pulse 68   Resp 19   Ht 5\' 8"  (1.727 m)   Wt 56.2 kg   SpO2 97%   BMI 18.85 kg/m  Vital signs in last 24 hours: Pulse Rate:  [62-68] 68 (02/25 1100) Resp:  [14-19] 19 (02/25 1100) BP: (167-176)/(89-98) 167/98 (02/25 1100) SpO2:  [97 %-98 %] 97 % (02/25 1100) Weight:  [56.2 kg] 56.2 kg (02/25 1040)   Physical Exam  Constitutional: Appears well-developed and well-nourished.  Psych: Affect appropriate to situation Neuro: AOX3, No  aphasia, no dysarthria, CN grossly intact, antigravity in all extremity without drift, sensory decreased to light touch on right , FTN intact  NIHSS 1  1A: Level of Consciousness - 0 1B: Ask Month and Age - 0 1C: 'Blink Eyes' & 'Squeeze Hands' - 0 2: Test Horizontal Extraocular Movements - 0 3: Test Visual Fields - 0 4: Test Facial Palsy - 0 5A: Test Left Arm Motor Drift - 0 5B: Test Right Arm Motor Drift - 0 6A: Test Left Leg Motor Drift - 0 6B: Test Right Leg Motor Drift - 0 7: Test Limb Ataxia - 0 8: Test Sensation - 1 9: Test Language/Aphasia- 0 10: Test Dysarthria - 0 11: Test Extinction/Inattention - 0 NIHSS score: 1  I have reviewed labs in epic and the results pertinent to this consultation are: CBC:  Recent Labs  Lab 06/24/23 1051 06/24/23 1057  WBC 5.8  --   NEUTROABS 4.4  --   HGB 17.1* 18.4*  HCT 49.3 54.0*  MCV 100.0  --   PLT 193  --     Basic Metabolic Panel:  Lab Results  Component Value Date   NA 140 06/24/2023   K 3.8 06/24/2023   CO2 23 06/24/2023   GLUCOSE 123 (H) 06/24/2023   BUN 10 06/24/2023   CREATININE 0.90 06/24/2023   CALCIUM 9.2 06/24/2023   GFRNONAA >60 06/24/2023   GFRAA 94  02/18/2019   Lipid Panel:  Lab Results  Component Value Date   LDLCALC 76 09/24/2017   HgbA1c: No results found for: "HGBA1C" Urine Drug Screen:     Component Value Date/Time   LABOPIA NONE DETECTED 06/24/2023 1134   COCAINSCRNUR NONE DETECTED 06/24/2023 1134   LABBENZ NONE DETECTED 06/24/2023 1134   AMPHETMU NONE DETECTED 06/24/2023 1134   THCU POSITIVE (A) 06/24/2023 1134   LABBARB NONE DETECTED 06/24/2023 1134    Alcohol Level     Component Value Date/Time   ETH <10 06/24/2023 1051     I have reviewed the images obtained:  CT Head without contrast 06/24/2023: No acute intracranial hemorrhage. No CT evidence of acute infarct.   CT angio Head and Neck with contrast 06/24/2023:  No intracranial large vessel occlusion or high-grade stenosis.   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 No luminal compromise. No significant stenosis of the cervical carotid or vertebral arteries.   ASSESSMENT/PLAN: 68yo M with sudden onset right sided numbness and dysequilibrium.  Right sided numbness Dysequilibrium Suspected  acute ischemic stroke - Recommend admission for observation and stroke w/u - MR Brain ordered to look for stroke - TTE to look for thrombus - Lipid panel, A1c for risk factor w/u - ASA 325mg  once followed by asa 81mg  and plavix 75mg  daily for 3 weeks  and then asa 81mg  daily monotherapy - Increase rosuvastatin to 20mg  if LDL>75 - goal BP: normotension - PT/OT - Stroke education - F/u with neuro in 3 months - Discussed plan with Dr Gayleen Orem via phone - Discussed plan with patient and his father at bedside  ADDENDUM - MRI brain reviewed: Acute infarct in the left thalamus. No mass effect. No midline shift. - Etiology: likely small vessel vs embolic - If TTE negative, recommend cardiac monitor to looks for A fib     Thank you for allowing [place] to participate in the care of this patient. If you have any further questions, please contact  me or neurohospitalist.   Lindie Spruce [facility] neurohospitalist

## 2023-06-24 NOTE — H&P (Signed)
 History and Physical    Patient: Richard Manning ZOX:[id number] DOB: 11-10-1955 DOA: 06/24/2023 DOS: the patient was seen and examined on 06/24/2023 PCP: Benita Stabile, MD  Patient coming from: Home  Chief Complaint:  Chief Complaint  Patient presents with   Numbness   HPI: Richard Manning is a 68 y.o. male with medical history significant of rheumatoid arthritis on methotrexate/prednisone prn, HLD who presents to the ED with right-sided numbness of arms/legs/face and feeling off balance since 9 AM yesterday.  He reports he fell this morning due to balance issues but did not hit his head or lose consciousness.  Denies any chest pain, palpitations, shortness of breath.  No recent illness, fever, nausea or vomiting.  He does endorse some left sided "minor discomfort" that has been coming and going for the last many months. He reports this is not pain or pressure. Denies any association with exertion.  Reports the pain frequently goes away on its own.  He has never sought medical attention for this.  In the ED blood pressures were hypertensive to 176/89, labs reveal polycythemia, UDS positive for THC.  CT angio head and neck without any evidence of acute intracranial hemorrhage or infarct, no LVO.  There is a focal irregularity in the distal left cervical ICA with an associated 3.5 mm immediately directed outpouching concerning for remote dissection or pseudoaneurysm but no luminal compromise.  Neurology was consulted from the ED and requested admission for stroke workup.  Review of Systems: As mentioned in the history of present illness. All other systems reviewed and are negative. Past Medical History:  Diagnosis Date   RA (rheumatoid arthritis) (HCC)    Past Surgical History:  Procedure Laterality Date   FEMORAL HERNIA REPAIR Right 01/23/2018   Procedure: HERNIA REPAIR FEMORAL with mesh;  Surgeon: [name], MD;  Location: [facility];  Service: General;  Laterality: Right;   INGUINAL  HERNIA REPAIR Right 01/23/2018   Procedure: HERNIA REPAIR INGUINAL ADULT WITH MESH;  Surgeon: [name], MD;  Location: [facility];  Service: General;  Laterality: Right;   Social History:  reports that he has been smoking cigarettes. He has a 10 pack-year smoking history. He has never used smokeless tobacco. He reports that he does not drink alcohol and does not use drugs.  Allergies  Allergen Reactions   Benadryl [Diphenhydramine]     Makes pt feel jittery/nervous    Peanuts [Peanut Oil] Rash    Family History  Problem Relation Age of Onset   Atrial fibrillation Mother    Cancer Mother        breast     Prior to Admission medications   Medication Sig Start Date End Date Taking? Authorizing Provider  folic acid (FOLVITE) 1 MG tablet TAKE 2 TABLETS (2 MG TOTAL) BY MOUTH DAILY. Patient taking differently: Take 1 mg by mouth daily. 07/30/18  Yes Deveshwar, Janalyn Rouse, MD  methotrexate (RHEUMATREX) 2.5 MG tablet TAKE 5 TABLETS BY MOUTH ON FRIDAY AND 5 TABLETS BY MOUTH ON SATURDAYS. 03/18/19  Yes Deveshwar, Janalyn Rouse, MD  predniSONE (DELTASONE) 10 MG tablet Take 10 mg by mouth as needed (arthritis). 04/29/23  Yes [provider]  rosuvastatin (CRESTOR) 5 MG tablet Take 5 mg by mouth daily. 04/16/23  Yes [provider]    Physical Exam: Vitals:   06/24/23 1040 06/24/23 1100 06/24/23 1500 06/24/23 1512  BP:  (!) 167/98 (!) 162/97   Pulse: 62 68 66   Resp: 14 19 20    Temp:  98.1 F (36.7 C)  TempSrc:    Oral  SpO2: 98% 97% 92%   Weight: 56.2 kg     Height: 5\' 8"  (1.727 m)      Constitutional:  Normal appearance. Non toxic-appearing.  HENT: Head Normocephalic and atraumatic.  Mucous membranes are moist.  Eyes:  Extraocular intact. Conjunctivae normal. Pupils are equal, round, and reactive to light.  Cardiovascular: Rate and Rhythm: Normal rate and regular rhythm.  Pulmonary: Non labored, symmetric rise of chest wall.  Musculoskeletal:  Normal range of motion.  Skin:  warm and dry. not jaundiced.  Neurological: No focal deficit present. alert. Oriented. 5/5 strength BLUE 5/5 BLLE Psychiatric: Mood and Affect congruent.   Data Reviewed:  Lab Results  Component Value Date   WBC 5.8 06/24/2023   HGB 18.4 (H) 06/24/2023   HCT 54.0 (H) 06/24/2023   MCV 100.0 06/24/2023   PLT 193 06/24/2023      Latest Ref Rng & Units 06/24/2023   10:57 AM 06/24/2023   10:51 AM 02/18/2019    9:58 AM  BMP  Glucose 70 - 99 mg/dL 960  454  96   BUN 8 - 23 mg/dL 10  11  10    Creatinine 0.61 - 1.24 mg/dL 0.98  1.19  1.47   BUN/Creat Ratio 6 - 22 (calc)   NOT APPLICABLE   Sodium 135 - 145 mmol/L 140  136  140   Potassium 3.5 - 5.1 mmol/L 3.8  3.6  4.4   Chloride 98 - 111 mmol/L 105  104  105   CO2 22 - 32 mmol/L  23  26   Calcium 8.9 - 10.3 mg/dL  9.2  9.5   CT ANGIO HEAD NECK W WO CM CLINICAL DATA:  Numbness to right side of body, disequilibrium, slight headache, concern for stroke.  EXAM: CT ANGIOGRAPHY HEAD AND NECK WITH AND WITHOUT CONTRAST  TECHNIQUE: Multidetector CT imaging of the head and neck was performed using the standard protocol during bolus administration of intravenous contrast. Multiplanar CT image reconstructions and MIPs were obtained to evaluate the vascular anatomy. Carotid stenosis measurements (when applicable) are obtained utilizing NASCET criteria, using the distal internal carotid diameter as the denominator.  RADIATION DOSE REDUCTION: This exam was performed according to the departmental dose-optimization program which includes automated exposure control, adjustment of the mA and/or kV according to patient size and/or use of iterative reconstruction technique.  CONTRAST:  75mL OMNIPAQUE IOHEXOL 350 MG/ML SOLN  COMPARISON:  MRI head 08/10/2003  FINDINGS: CT HEAD FINDINGS  Brain: No acute intracranial hemorrhage. No CT evidence of acute infarct. No edema, mass effect, or midline shift. The basilar cisterns are  patent.  Ventricles: Ventricles are normal in size and configuration.  Vascular: No hyperdense vessel.  Skull: No acute or aggressive finding.  Sinuses/orbits: The visualized paranasal sinuses are clear. Orbits are symmetric.  Other: Mastoid air cells are clear.  CTA NECK FINDINGS  Aortic arch: Standard configuration of the aortic arch. Imaged portion shows no evidence of aneurysm or dissection. Mild atherosclerosis of the aortic arch. No significant stenosis of the major arch vessel origins.  Pulmonary arteries: As permitted by contrast timing, there are no filling defects in the visualized pulmonary arteries.  Subclavian arteries: The subclavian arteries are patent bilaterally. Mild atherosclerosis involving the proximal subclavian arteries bilaterally without stenosis.  Right carotid system: No evidence of dissection, stenosis (50% or greater), or occlusion. Tortuosity of the mid cervical ICA.  Left carotid system: Patent. No significant stenosis (50%  or greater). No significant calcified atherosclerosis. There is focal irregularity of the distal cervical ICA approximately 1.5 cm proximal to the skull base with an associated 3.5 x 2 mm medially directed outpouching concerning for remote dissection and associated pseudoaneurysm. There is no significant luminal compromise.  Vertebral arteries: Codominant. No evidence of dissection, stenosis (50% or greater), or occlusion.  Skeleton: No acute or aggressive finding noted.  Other neck: The visualized airway is patent. No cervical lymphadenopathy.  Upper chest: Biapical pleuroparenchymal scarring.  Emphysema noted.  Review of the MIP images confirms the above findings  CTA HEAD FINDINGS  ANTERIOR CIRCULATION: The intracranial ICAs are patent bilaterally. No significant stenosis, proximal occlusion, aneurysm, or vascular malformation.  MCAs: The middle cerebral arteries are patent bilaterally.  ACAs: The anterior  cerebral arteries are patent bilaterally.  POSTERIOR CIRCULATION: No significant stenosis, proximal occlusion, aneurysm, or vascular malformation.  PCAs: The posterior cerebral arteries are patent bilaterally.  Pcomm: Not well visualized.  SCAs: The superior cerebellar arteries are patent bilaterally.  Basilar artery: Patent  AICAs: Not well visualized.  PICAs: Patent  Vertebral arteries: The intracranial vertebral arteries are patent.  Venous sinuses: As permitted by contrast timing, patent. There is likely a prominent arachnoid granulation in the vein of Galen at the junction with the straight sinus.  Anatomic variants: None  Review of the MIP images confirms the above findings  IMPRESSION: 1. No acute intracranial abnormality. 2. No intracranial large vessel occlusion or high-grade stenosis. 3. 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 No luminal compromise. 4. No significant stenosis of the cervical carotid or vertebral arteries. 5. Aortic Atherosclerosis (ICD10-I70.0) and Emphysema (ICD10-J43.9).  Electronically Signed   By: Emily Filbert M.D.   On: 06/24/2023 12:42    Assessment and Plan:  Right Sided Weakness -- CTA Head/Neck WNL -- MRI brain pending - TSH within normal limits - Echo, LDL, A1c pending - Neuro consult - Proceed with aspirin Plavix for 3 weeks then aspirin 81 mg daily - Normotensive blood pressure goals - Continue to monitor on telemetry - Follow-up with neurology in 3 months outpatient  Elevated blood pressure without history of hypertension - Does not take meds outpatient - Will start amlodipine and monitor response  Rheumatoid arthritis - On methotrexate and prednisone - Monitor closely for infection, stable at this time  Polycythemia - Hemoglobin 18.4 on arrival, MCV within normal limits.  Hematocrit 54 --  Suspect this is secondary to chronic tobacco abuse causing pulm dysfunction though pt is not currently hypoxic - On chart review appears normal 4 years ago, no recent labs available for comparison -- patient does not appear clinically dehydrated - CBC in a.m. consider further work up if persistent  Tobacco abuse - 10-12 cigarettes a day x 53yrs -Counseled on cessation - Nicotine patch while admitted    Advance Care Planning:   Code Status: Limited: Do not attempt resuscitation (DNR) -DNR-LIMITED -Do Not Intubate/DNI when discussing CODE STATUS, patient reports he has had this discussion with his parents on multiple occasions and is very clear that he would like to be DNR/DNI.  He does not have a formal advanced directive.  Consults: Neurology  Family Communication: Discussed directly with the patient and his father at bedside  Severity of Illness: The appropriate patient status for this patient is OBSERVATION. Observation status is judged to be reasonable and necessary in order to provide the required intensity of service  to ensure the patient's safety. The patient's presenting symptoms, physical exam findings, and initial radiographic and laboratory data in the context of their medical condition is felt to place them at decreased risk for further clinical deterioration. Furthermore, it is anticipated that the patient will be medically stable for discharge from the hospital within 2 midnights of admission.   Total time spent personally developing treatment plan with patient, nursing, discussions with consultants, care coordination, monitoring response to treatment, examination, and advance care planning: 75 minutes  Author: Debarah Crape, DO 06/24/2023 5:04 PM  For on call review [email or web address].

## 2023-06-24 NOTE — ED Notes (Signed)
Report given to RN on floor...

## 2023-06-24 NOTE — ED Provider Notes (Signed)
 [place] [facility] AT [facility] Provider Note   CSN: [id number] Arrival date & time: 06/24/23  1022     History  Chief Complaint  Patient presents with   Numbness    Richard Manning is a 68 y.o. male with PMH as listed below who presents with right-sided numbness of arm/leg/face and "disequilibrium" since yesterday at 9 am. Larey Seat this AM due to feeling off balance but did not hit his head or lose consciousness. No chest pain, palpitations, SOB. No recent illnesses. Also reports feeling weak on R side. H/o RA and takes methotrexate/prednisone. No recent medication changes.    Past Medical History:  Diagnosis Date   RA (rheumatoid arthritis) (HCC)        Home Medications Prior to Admission medications   Medication Sig Start Date End Date Taking? Authorizing Provider  folic acid (FOLVITE) 1 MG tablet TAKE 2 TABLETS (2 MG TOTAL) BY MOUTH DAILY. Patient taking differently: Take 1 mg by mouth daily. 07/30/18  Yes Deveshwar, Janalyn Rouse, MD  methotrexate (RHEUMATREX) 2.5 MG tablet TAKE 5 TABLETS BY MOUTH ON FRIDAY AND 5 TABLETS BY MOUTH ON SATURDAYS. 03/18/19  Yes Deveshwar, Janalyn Rouse, MD  predniSONE (DELTASONE) 10 MG tablet Take 10 mg by mouth as needed (arthritis). 04/29/23  Yes [provider]  rosuvastatin (CRESTOR) 5 MG tablet Take 5 mg by mouth daily. 04/16/23  Yes [provider]      Allergies    Benadryl [diphenhydramine] and Peanuts [peanut oil]    Review of Systems   Review of Systems A 10 point review of systems was performed and is negative unless otherwise reported in HPI.  Physical Exam Updated Vital Signs BP (!) 167/98   Pulse 68   Resp 19   Ht 5\' 8"  (1.727 m)   Wt 56.2 kg   SpO2 97%   BMI 18.85 kg/m  Physical Exam General: Normal appearing male, lying in bed.  HEENT: NCAT, PERRLA, EOMI, no nystagmus, sclera anicteric, MMM, trachea midline.  Tongue protrudes midline. Cardiology: RRR, no murmurs/rubs/gallops. Resp:  Normal respiratory rate and effort. CTAB, no wheezes, rhonchi, crackles.  Abd: Soft, non-tender, non-distended. No rebound tenderness or guarding.  GU: Deferred. MSK: No peripheral edema or signs of trauma. Extremities without deformity or TTP.  Skin: warm, dry.  Neuro: A&Ox4, CNs II-XII grossly intact.  Very mild right sided nasolabial fold flattening.  5 out of 5 strength in all extremities.. Sensation grossly diminished to light touch in right upper, right lower extremities and side of face.  Normal speech.  Normal repetition. Psych: Normal mood and affect.   1a  Level of consciousness: 0=alert; keenly responsive  1b. LOC questions:  0=Performs both tasks correctly  1c. LOC commands: 0=Performs both tasks correctly  2.  Best Gaze: 0=normal  3.  Visual: 0=No visual loss  4. Facial Palsy: 1=Minor paralysis (flattened nasolabial fold, asymmetric on smiling)  5a.  Motor left arm: 0=No drift, limb holds 90 (or 45) degrees for full 10 seconds  5b.  Motor right arm: 0=No drift, limb holds 90 (or 45) degrees for full 10 seconds  6a. motor left leg: 0=No drift, limb holds 90 (or 45) degrees for full 10 seconds  6b  Motor right leg:  0=No drift, limb holds 90 (or 45) degrees for full 10 seconds  7. Limb Ataxia: 0=Absent  8.  Sensory: 1=Mild to moderate sensory loss; patient feels pinprick is less sharp or is dull on the affected side; there is a loss of  superficial pain with pinprick but patient is aware He is being touched  9. Best Language:  0=No aphasia, normal  10. Dysarthria: 0=Normal  11. Extinction and Inattention: 0=No abnormality   Total:   2        ED Results / Procedures / Treatments   Labs (all labs ordered are listed, but only abnormal results are displayed) Labs Reviewed  CBC WITH DIFFERENTIAL/PLATELET - Abnormal; Notable for the following components:      Result Value   Hemoglobin 17.1 (*)    MCH 34.7 (*)    All other components within normal limits  COMPREHENSIVE  METABOLIC PANEL - Abnormal; Notable for the following components:   Glucose, Bld 122 (*)    All other components within normal limits  URINALYSIS, ROUTINE W REFLEX MICROSCOPIC - Abnormal; Notable for the following components:   Color, Urine STRAW (*)    All other components within normal limits  RAPID URINE DRUG SCREEN, HOSP PERFORMED - Abnormal; Notable for the following components:   Tetrahydrocannabinol POSITIVE (*)    All other components within normal limits  I-STAT CHEM 8, ED - Abnormal; Notable for the following components:   Glucose, Bld 123 (*)    Hemoglobin 18.4 (*)    HCT 54.0 (*)    All other components within normal limits  MAGNESIUM  ETHANOL  HIV ANTIBODY (ROUTINE TESTING W REFLEX)  HEMOGLOBIN A1C  TSH  LDL CHOLESTEROL, DIRECT  CBG MONITORING, ED    EKG EKG Interpretation Date/Time:  Tuesday June 24 2023 10:39:20 EST Ventricular Rate:  63 PR Interval:  136 QRS Duration:  104 QT Interval:  425 QTC Calculation: 435 R Axis:   69  Text Interpretation: Sinus rhythm with respiratory variation RSR' in V1 or V2, right VCD or RVH Baseline artifact in several leads Confirmed by Vivi Barrack [phone]) on 06/24/2023 11:15:05 AM  Radiology CT ANGIO HEAD NECK W WO CM Result Date: 06/24/2023 CLINICAL DATA:  Numbness to right side of body, disequilibrium, slight headache, concern for stroke. EXAM: CT ANGIOGRAPHY HEAD AND NECK WITH AND WITHOUT CONTRAST TECHNIQUE: Multidetector CT imaging of the head and neck was performed using the standard protocol during bolus administration of intravenous contrast. Multiplanar CT image reconstructions and MIPs were obtained to evaluate the vascular anatomy. Carotid stenosis measurements (when applicable) are obtained utilizing NASCET criteria, using the distal internal carotid diameter as the denominator. RADIATION DOSE REDUCTION: This exam was performed according to the departmental dose-optimization program which includes automated exposure  control, adjustment of the mA and/or kV according to patient size and/or use of iterative reconstruction technique. CONTRAST:  75mL OMNIPAQUE IOHEXOL 350 MG/ML SOLN COMPARISON:  MRI head 08/10/2003 FINDINGS: CT HEAD FINDINGS Brain: No acute intracranial hemorrhage. No CT evidence of acute infarct. No edema, mass effect, or midline shift. The basilar cisterns are patent. Ventricles: Ventricles are normal in size and configuration. Vascular: No hyperdense vessel. Skull: No acute or aggressive finding. Sinuses/orbits: The visualized paranasal sinuses are clear. Orbits are symmetric. Other: Mastoid air cells are clear. CTA NECK FINDINGS Aortic arch: Standard configuration of the aortic arch. Imaged portion shows no evidence of aneurysm or dissection. Mild atherosclerosis of the aortic arch. No significant stenosis of the major arch vessel origins. Pulmonary arteries: As permitted by contrast timing, there are no filling defects in the visualized pulmonary arteries. Subclavian arteries: The subclavian arteries are patent bilaterally. Mild atherosclerosis involving the proximal subclavian arteries bilaterally without stenosis. Right carotid system: No evidence of dissection, stenosis (50% or greater), or  occlusion. Tortuosity of the mid cervical ICA. Left carotid system: Patent. No significant stenosis (50% or greater). No significant calcified atherosclerosis. There is focal irregularity of the distal cervical ICA approximately 1.5 cm proximal to the skull base with an associated 3.5 x 2 mm medially directed outpouching concerning for remote dissection and associated pseudoaneurysm. There is no significant luminal compromise. Vertebral arteries: Codominant. No evidence of dissection, stenosis (50% or greater), or occlusion. Skeleton: No acute or aggressive finding noted. Other neck: The visualized airway is patent. No cervical lymphadenopathy. Upper chest: Biapical pleuroparenchymal scarring.  Emphysema noted. Review of  the MIP images confirms the above findings CTA HEAD FINDINGS ANTERIOR CIRCULATION: The intracranial ICAs are patent bilaterally. No significant stenosis, proximal occlusion, aneurysm, or vascular malformation. MCAs: The middle cerebral arteries are patent bilaterally. ACAs: The anterior cerebral arteries are patent bilaterally. POSTERIOR CIRCULATION: No significant stenosis, proximal occlusion, aneurysm, or vascular malformation. PCAs: The posterior cerebral arteries are patent bilaterally. Pcomm: Not well visualized. SCAs: The superior cerebellar arteries are patent bilaterally. Basilar artery: Patent AICAs: Not well visualized. PICAs: Patent Vertebral arteries: The intracranial vertebral arteries are patent. Venous sinuses: As permitted by contrast timing, patent. There is likely a prominent arachnoid granulation in the vein of Galen at the junction with the straight sinus. Anatomic variants: None Review of the MIP images confirms the above findings IMPRESSION: 1. No acute intracranial abnormality. 2. No intracranial large vessel occlusion or high-grade stenosis. 3. 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 No luminal compromise. 4. No significant stenosis of the cervical carotid or vertebral arteries. 5. Aortic Atherosclerosis (ICD10-I70.0) and Emphysema (ICD10-J43.9). Electronically Signed   By: Emily Filbert M.D.   On: 06/24/2023 12:42    Procedures Procedures    Medications Ordered in ED Medications  iohexol (OMNIPAQUE) 350 MG/ML injection 75 mL (75 mLs Intravenous Contrast Given 06/24/23 1112)    ED Course/ Medical Decision Making/ A&P                          Medical Decision Making Amount and/or Complexity of Data Reviewed Labs: ordered. Radiology: ordered. Decision-making details documented in ED Course.  Risk Prescription drug management. Decision regarding  hospitalization.    This patient presents to the ED for concern of R sided numbness, this involves an extensive number of treatment options, and is a complaint that carries with it a high risk of complications and morbidity.  I considered the following differential and admission for this acute, potentially life threatening condition.   MDM:    Given the acute onset of neurological symptoms, stroke is the most concerning etiology of these acute symptoms. The neuro exam is significant for Mild right facial droop and decreased sensation in R face and R body. Outside of window for TNK or thrombectomy. Also consider electrolyte derangements.  No hypo or hyperglycemia.  No significant metabolic derangements.  No history of seizures or seizure-like activity indicate Todd's paralysis.  No headache to indicate complex migraine.  LKN: 9 am yesterday (26 hours ago) Glucose: 123 AC: No BP: 176/89   Clinical Course as of 06/24/23 1359  Tue Jun 24, 2023  1246 CT ANGIO HEAD NECK W WO CM 1. No acute intracranial abnormality. 2. No intracranial large vessel occlusion or high-grade stenosis. 3. Focal irregularity of the distal left cervical ICA with an associated 3.5 mm medially directed outpouching concerning for remote dissection  and pseudoaneurysm. No luminal compromise. 4. No significant stenosis of the cervical carotid or vertebral arteries. 5. Aortic Atherosclerosis (ICD10-I70.0) and Emphysema (ICD10-J43.9).   [HN]  1314 D/w Dr. Melynda Ripple w/ neurology who recommends admission and stroke w/u.  [HN]    Clinical Course User Index [HN] Loetta Rough, MD    Labs: I Ordered, and personally interpreted labs.  The pertinent results include:  those listed above  Imaging Studies ordered: I ordered imaging studies including CTH I independently visualized and interpreted imaging. I agree with the radiologist interpretation  Additional history obtained from chart review.    Cardiac Monitoring: The  patient was maintained on a cardiac monitor.  I personally viewed and interpreted the cardiac monitored which showed an underlying rhythm of: NSR  Reevaluation: After the interventions noted above, I reevaluated the patient and found that they have :stayed the same  Social Determinants of Health: Lives independently  Disposition:  Admit to hospitalist  Co morbidities that complicate the patient evaluation  Past Medical History:  Diagnosis Date   RA (rheumatoid arthritis) (HCC)      Medicines Meds ordered this encounter  Medications   iohexol (OMNIPAQUE) 350 MG/ML injection 75 mL    I have reviewed the patients home medicines and have made adjustments as needed  Problem List / ED Course: Problem List Items Addressed This Visit   None Visit Diagnoses       Right sided numbness    -  Primary                   This note was created using dictation software, which may contain spelling or grammatical errors.    Loetta Rough, MD 06/24/23 [phone]

## 2023-06-25 ENCOUNTER — Observation Stay (HOSPITAL_BASED_OUTPATIENT_CLINIC_OR_DEPARTMENT_OTHER): Payer: Medicare Other

## 2023-06-25 ENCOUNTER — Telehealth: Payer: Self-pay | Admitting: *Deleted

## 2023-06-25 DIAGNOSIS — I639 Cerebral infarction, unspecified: Secondary | ICD-10-CM

## 2023-06-25 DIAGNOSIS — R531 Weakness: Secondary | ICD-10-CM

## 2023-06-25 DIAGNOSIS — I6389 Other cerebral infarction: Secondary | ICD-10-CM

## 2023-06-25 LAB — COMPREHENSIVE METABOLIC PANEL
ALT: 24 U/L (ref 0–44)
AST: 23 U/L (ref 15–41)
Albumin: 3.6 g/dL (ref 3.5–5.0)
Alkaline Phosphatase: 77 U/L (ref 38–126)
Anion gap: 10 (ref 5–15)
BUN: 13 mg/dL (ref 8–23)
CO2: 22 mmol/L (ref 22–32)
Calcium: 9 mg/dL (ref 8.9–10.3)
Chloride: 105 mmol/L (ref 98–111)
Creatinine, Ser: 0.72 mg/dL (ref 0.61–1.24)
GFR, Estimated: >60 mL/min (ref >60)
Glucose, Bld: 78 mg/dL (ref 70–99)
Potassium: 3.7 mmol/L (ref 3.5–5.1)
Sodium: 137 mmol/L (ref 135–145)
Total Bilirubin: 1.5 mg/dL — ABNORMAL HIGH (ref 0.0–1.2)
Total Protein: 7.1 g/dL (ref 6.5–8.1)

## 2023-06-25 LAB — MAGNESIUM: Magnesium: 2.3 mg/dL (ref 1.7–2.4)

## 2023-06-25 LAB — CBC WITH DIFFERENTIAL/PLATELET
Abs Immature Granulocytes: 0.02 10*3/uL (ref 0.00–0.07)
Basophils Absolute: 0 10*3/uL (ref 0.0–0.1)
Basophils Relative: 0 %
Eosinophils Absolute: 0.1 10*3/uL (ref 0.0–0.5)
Eosinophils Relative: 1 %
HCT: 46.1 % (ref 39.0–52.0)
Hemoglobin: 15.6 g/dL (ref 13.0–17.0)
Immature Granulocytes: 0 %
Lymphocytes Relative: 23 %
Lymphs Abs: 1.6 10*3/uL (ref 0.7–4.0)
MCH: 34.4 pg — ABNORMAL HIGH (ref 26.0–34.0)
MCHC: 33.8 g/dL (ref 30.0–36.0)
MCV: 101.5 fL — ABNORMAL HIGH (ref 80.0–100.0)
Monocytes Absolute: 0.3 10*3/uL (ref 0.1–1.0)
Monocytes Relative: 4 %
Neutro Abs: 5 10*3/uL (ref 1.7–7.7)
Neutrophils Relative %: 72 %
Platelets: 200 10*3/uL (ref 150–400)
RBC: 4.54 MIL/uL (ref 4.22–5.81)
RDW: 13.6 % (ref 11.5–15.5)
WBC: 7 10*3/uL (ref 4.0–10.5)
nRBC: 0 % (ref 0.0–0.2)

## 2023-06-25 LAB — ECHOCARDIOGRAM COMPLETE BUBBLE STUDY
AR max vel: 1.68 cm2
AV Area VTI: 1.65 cm2
AV Area mean vel: 1.49 cm2
AV Mean grad: 2 mm[Hg]
AV Peak grad: 3 mm[Hg]
Ao pk vel: 0.86 m/s
Area-P 1/2: 4.17 cm2
Calc EF: 68.8 %
S' Lateral: 2.1 cm
Single Plane A2C EF: 66.9 %
Single Plane A4C EF: 70.2 %

## 2023-06-25 LAB — HEMOGLOBIN A1C
Hgb A1c MFr Bld: 5.3 % (ref 4.8–5.6)
Mean Plasma Glucose: 105 mg/dL

## 2023-06-25 LAB — PHOSPHORUS: Phosphorus: 2.4 mg/dL — ABNORMAL LOW (ref 2.5–4.6)

## 2023-06-25 MED ORDER — FOLIC ACID 1 MG PO TABS: 1.0000 mg | ORAL_TABLET | Freq: Every day | ORAL | Status: AC

## 2023-06-25 MED ORDER — ASPIRIN 81 MG PO CHEW: 81.0000 mg | CHEWABLE_TABLET | Freq: Every day | ORAL | Status: AC

## 2023-06-25 MED ORDER — CLOPIDOGREL BISULFATE 75 MG PO TABS: 75.0000 mg | ORAL_TABLET | Freq: Every day | ORAL | 0 refills | Status: AC

## 2023-06-25 MED ORDER — LOSARTAN POTASSIUM 25 MG PO TABS: 25.0000 mg | ORAL_TABLET | Freq: Every day | ORAL | 2 refills | Status: AC

## 2023-06-25 MED ORDER — AMLODIPINE BESYLATE 5 MG PO TABS: 5.0000 mg | ORAL_TABLET | Freq: Every day | ORAL | 2 refills | Status: AC

## 2023-06-25 NOTE — Evaluation (Addendum)
 Physical Therapy Brief Evaluation and Discharge Note Patient Details Name: Richard Manning MRN: [id number] DOB: [birth year] Today's Date: 06/25/2023   History of Present Illness  a 68 y.o. male with medical history significant of rheumatoid arthritis on methotrexate/prednisone prn, HLD who presents to the ED with right-sided numbness of arms/legs/face and feeling off balance since 9 AM yesterday.  He reports he fell this morning due to balance issues but did not hit his head or lose consciousness.  Denies any chest pain, palpitations, shortness of breath.  No recent illness, fever, nausea or vomiting  Clinical Impression  Pt tolerating co-evaluation well. Showing no major deficits. Pt reporting questionable sensory testing in BLE but no concerns with functional mobility, safety with transfers and ambulation is intact. Patient is performing at functional baseline, no safety concerns noted throughout evaluation.  Patient is safe to DC home with appropriate services, DME, and any other needs indicated.  PT to sign off at this time.  Please reconsult acute PT services if functional changes occur while admitted.  Thank you.       PT Assessment    Assistance Needed at Discharge       Equipment Recommendations None recommended by PT  Recommendations for Other Services       Precautions/Restrictions Precautions Precautions: None Restrictions Weight Bearing Restrictions Per Provider Order: No        Mobility  Bed Mobility          Transfers Overall transfer level: Independent Equipment used: None               General transfer comment: power to stand from EOB once no assist. once from toilet, use of grab bars. Safe and steady.    Ambulation/Gait Ambulation/Gait assistance: Independent Gait Distance (Feet): 50 Feet Assistive device: None Gait Pattern/deviations: WFL(Within Functional Limits)   General Gait Details: mild external rotation of BLE during IC and stance, no  safety concerns.  Home Activity Instructions    Stairs            Modified Rankin (Stroke Patients Only)        Balance   Sitting-balance support: No upper extremity supported Sitting balance-Leahy Scale: Normal     Standing balance support: No upper extremity supported, During functional activity Standing balance-Leahy Scale: Good Standing balance comment: with appropriate balance reactions in standing with BUE swinging.          Pertinent Vitals/Pain   Pain Assessment Pain Assessment: No/denies pain     Home Living   Living Arrangements: Alone       Home Equipment: None        Prior Function        UE/LE Assessment               Communication         Cognition         General Comments      Exercises     Assessment/Plan    PT Problem List         PT Visit Diagnosis      No Skilled PT Patient is independent with all acitivity/mobility   Co-evaluation PT/OT/SLP Co-Evaluation/Treatment: Yes Reason for Co-Treatment: To address functional/ADL transfers PT goals addressed during session: Mobility/safety with mobility          AMPAC 6 Clicks Help needed turning from your back to your side while in a flat bed without using bedrails?: None Help needed moving from lying on your back to sitting on  the side of a flat bed without using bedrails?: None Help needed moving to and from a bed to a chair (including a wheelchair)?: None Help needed standing up from a chair using your arms (e.g., wheelchair or bedside chair)?: None Help needed to walk in hospital room?: None Help needed climbing 3-5 steps with a railing? : None 6 Click Score: 24      End of Session Equipment Utilized During Treatment: Gait belt Activity Tolerance: Patient tolerated treatment well Patient left: in bed;with call bell/phone within reach Nurse Communication: Mobility status       Time: 8657-8469 PT Time Calculation (min) (ACUTE ONLY): 13  min  Charges:   PT Evaluation $PT Eval Low Complexity: 1 Low      Elie Goody, DPT [facility] [place] [address] [phone] office  Nelida Meuse  06/25/2023, 9:21 AM

## 2023-06-25 NOTE — Progress Notes (Signed)
 Discharge instructions given to patient. IV removed from left arm. Telemetry box removed and returned to its correct holding place and signed back into notebook with second witness. Patient was taken downstairs via wheelchair with tech.

## 2023-06-25 NOTE — Telephone Encounter (Signed)
-----   Message from [facility] sent at 06/25/2023  3:48 PM EST ----- Regarding: 30 day cardiac monitor request Dear Jake Seats,  I am requesting this patient be set up for a 30 day cardiac monitor as part of his stroke work up.  He is discharging from hospital today.  You can mail the monitor to his home.  This is for stroke work up per his neurologist request.  Thank you so much for your assistance!   Maryln Manuel MD [facility]  [place]

## 2023-06-25 NOTE — Telephone Encounter (Signed)
Order placed and pt enrolled in [employer].

## 2023-06-25 NOTE — Discharge Instructions (Addendum)
 PLEASE TAKE ASPIRIN 81 MG AND PLAVIX 75 MG DAILY FOR 3 WEEKS FOLLOWED BY ASPIRIN 81 MG DAILY MONOTHERAPY  A 30 DAY CARDIAC MONITOR WILL BE MAILED TO YOUR HOME FOR YOU TO WEAR AS PART OF THE STROKE WORK UP.  PLEASE NOTIFY YOUR PRIMARY CARE PROVIDER IF YOU HAVE NOT RECEIVED IT BY YOUR 2 WEEK FOLLOW UP APPOINTMENT.    THANK YOU.    IMPORTANT INFORMATION: PAY CLOSE ATTENTION   PHYSICIAN DISCHARGE INSTRUCTIONS  Follow with Primary care provider  Benita Stabile, MD  and other consultants as instructed by your Hospitalist Physician  SEEK MEDICAL CARE OR RETURN TO EMERGENCY ROOM IF SYMPTOMS COME BACK, WORSEN OR NEW PROBLEM DEVELOPS   Please note: You were cared for by a hospitalist during your hospital stay. Every effort will be made to forward records to your primary care provider.  You can request that your primary care provider send for your hospital records if they have not received them.  Once you are discharged, your primary care physician will handle any further medical issues. Please note that NO REFILLS for any discharge medications will be authorized once you are discharged, as it is imperative that you return to your primary care physician (or establish a relationship with a primary care physician if you do not have one) for your post hospital discharge needs so that they can reassess your need for medications and monitor your lab values.  Please get a complete blood count and chemistry panel checked by your Primary MD at your next visit, and again as instructed by your Primary MD.  Get Medicines reviewed and adjusted: Please take all your medications with you for your next visit with your Primary MD  Laboratory/radiological data: Please request your Primary MD to go over all hospital tests and procedure/radiological results at the follow up, please ask your primary care provider to get all Hospital records sent to his/her office.  In some cases, they will be blood work, cultures and biopsy  results pending at the time of your discharge. Please request that your primary care provider follow up on these results.  If you are diabetic, please bring your blood sugar readings with you to your follow up appointment with primary care.    Please call and make your follow up appointments as soon as possible.    Also Note the following: If you experience worsening of your admission symptoms, develop shortness of breath, life threatening emergency, suicidal or homicidal thoughts you must seek medical attention immediately by calling 911 or calling your MD immediately  if symptoms less severe.  You must read complete instructions/literature along with all the possible adverse reactions/side effects for all the Medicines you take and that have been prescribed to you. Take any new Medicines after you have completely understood and accpet all the possible adverse reactions/side effects.   Do not drive when taking Pain medications or sleeping medications (Benzodiazepines)  Do not take more than prescribed Pain, Sleep and Anxiety Medications. It is not advisable to combine anxiety,sleep and pain medications without talking with your primary care practitioner  Special Instructions: If you have smoked or chewed Tobacco  in the last 2 yrs please stop smoking, stop any regular Alcohol  and or any Recreational drug use.  Wear Seat belts while driving.  Do not drive if taking any narcotic, mind altering or controlled substances or recreational drugs or alcohol.

## 2023-06-25 NOTE — Plan of Care (Signed)

## 2023-06-25 NOTE — Care Management Obs Status (Signed)
 MEDICARE OBSERVATION STATUS NOTIFICATION   Patient Details  Name: Richard Manning MRN: [id number] Date of Birth: [birth year]   Medicare Observation Status Notification Given:  Yes    Corey Harold 06/25/2023, 11:44 AM

## 2023-06-25 NOTE — Discharge Summary (Signed)
 Physician Discharge Summary  Richard Manning VWU:[id number] DOB: 01-06-1956 DOA: 06/24/2023  PCP: Benita Stabile, MD  Admit date: 06/24/2023 Discharge date: 06/25/2023  Admitted From:  HOME  Disposition: HOME   Recommendations for Outpatient Follow-up:  Follow up with PCP in 2 weeks Follow up with neurologist in 3 months   Discharge Condition:   STABLE  CODE STATUS: DNR   DIET: Heart Healthy   Brief Hospitalization Summary: Please see all hospital notes, images, labs for full details of the hospitalization. Admission provider HPI:  68 y.o. male with medical history significant of rheumatoid arthritis on methotrexate/prednisone prn, HLD who presents to the ED with right-sided numbness of arms/legs/face and feeling off balance since 9 AM yesterday.  He reports he fell this morning due to balance issues but did not hit his head or lose consciousness.  Denies any chest pain, palpitations, shortness of breath.  No recent illness, fever, nausea or vomiting.   He does endorse some left sided "minor discomfort" that has been coming and going for the last many months. He reports this is not pain or pressure. Denies any association with exertion.  Reports the pain frequently goes away on its own.  He has never sought medical attention for this.   In the ED blood pressures were hypertensive to 176/89, labs reveal polycythemia, UDS positive for THC.  CT angio head and neck without any evidence of acute intracranial hemorrhage or infarct, no LVO.  There is a focal irregularity in the distal left cervical ICA with an associated 3.5 mm immediately directed outpouching concerning for remote dissection or pseudoaneurysm but no luminal compromise.  Neurology was consulted from the ED and requested admission for stroke workup.  HOSPITAL COURSE BY PROBLEM LIST  Acute CVA in the left thalamus - etiology likely small vessel vs embolic - cardiac monitor requested from [facility] to mail to  patient's home; pt updated and requested to please wear  - neurology recommended asa 81 mg and plavix 75 mg daily for 3 weeks followed by aspirin 81 mg daily monotherapy;  -continue rosuvastatin 5 mg daily due to direct LDL 58  - normotension desired, Rx for amlodipine 5 mg daily and losartan 25 mg daily - PT/OT completed with no further work up recommended - TTE reassuring, no thrombus noted; see report: LVEF 55-60% with grade 1 DD; negative bubble study - follow up with [facility] neurology in 3 months  - A1c 5.3%   Discharge Diagnoses:  Acute CVA  Discharge Instructions: Discharge Instructions     Ambulatory referral to Neurology   Complete by: As directed    An appointment is requested in approximately: 12 weeks      Allergies as of 06/25/2023       Reactions   Benadryl [diphenhydramine]    Makes pt feel jittery/nervous    Peanuts [peanut Oil] Rash        Medication List     TAKE these medications    amLODipine 5 MG tablet Commonly known as: NORVASC Take 1 tablet (5 mg total) by mouth daily. Start taking on: June 26, 2023   aspirin 81 MG chewable tablet Chew 1 tablet (81 mg total) by mouth daily. Start taking on: June 26, 2023   clopidogrel 75 MG tablet Commonly known as: PLAVIX Take 1 tablet (75 mg total) by mouth daily for 20 days. Take with aspirin 81 mg Start taking on: June 26, 2023   folic acid 1 MG tablet Commonly known as: FOLVITE Take  1 tablet (1 mg total) by mouth daily.   losartan 25 MG tablet Commonly known as: Cozaar Take 1 tablet (25 mg total) by mouth daily.   methotrexate 2.5 MG tablet Commonly known as: RHEUMATREX TAKE 5 TABLETS BY MOUTH ON FRIDAY AND 5 TABLETS BY MOUTH ON SATURDAYS.   predniSONE 10 MG tablet Commonly known as: DELTASONE Take 10 mg by mouth as needed (arthritis).   rosuvastatin 5 MG tablet Commonly known as: CRESTOR Take 5 mg by mouth daily.        Follow-up Information     Benita Stabile, MD. Schedule  an appointment as soon as possible for a visit in 2 week(s).   Specialty: Internal Medicine Why: Hospital Follow Up Contact information: [address] [facility] [address] [phone]         [facility]. Schedule an appointment as soon as possible for a visit in 3 month(s).   Specialty: Neurology Why: Hospital Follow Up Contact information: [address] [place] [address] [phone]               Allergies  Allergen Reactions   Benadryl [Diphenhydramine]     Makes pt feel jittery/nervous    Peanuts [Peanut Oil] Rash   Allergies as of 06/25/2023       Reactions   Benadryl [diphenhydramine]    Makes pt feel jittery/nervous    Peanuts [peanut Oil] Rash        Medication List     TAKE these medications    amLODipine 5 MG tablet Commonly known as: NORVASC Take 1 tablet (5 mg total) by mouth daily. Start taking on: June 26, 2023   aspirin 81 MG chewable tablet Chew 1 tablet (81 mg total) by mouth daily. Start taking on: June 26, 2023   clopidogrel 75 MG tablet Commonly known as: PLAVIX Take 1 tablet (75 mg total) by mouth daily for 20 days. Take with aspirin 81 mg Start taking on: June 26, 2023   folic acid 1 MG tablet Commonly known as: FOLVITE Take 1 tablet (1 mg total) by mouth daily.   losartan 25 MG tablet Commonly known as: Cozaar Take 1 tablet (25 mg total) by mouth daily.   methotrexate 2.5 MG tablet Commonly known as: RHEUMATREX TAKE 5 TABLETS BY MOUTH ON FRIDAY AND 5 TABLETS BY MOUTH ON SATURDAYS.   predniSONE 10 MG tablet Commonly known as: DELTASONE Take 10 mg by mouth as needed (arthritis).   rosuvastatin 5 MG tablet Commonly known as: CRESTOR Take 5 mg by mouth daily.        Procedures/Studies: ECHOCARDIOGRAM COMPLETE BUBBLE STUDY Result Date: 06/25/2023    ECHOCARDIOGRAM REPORT   Patient Name:   Richard Manning Date of Exam: 06/25/2023  Medical Rec #:  [id number]          Height:       68.0 in Accession #:    [id number]         Weight:       124.0 lb Date of Birth:  05-02-1955           BSA:          1.668 m Patient Age:    67 years           BP:           146/88 mmHg Patient Gender: M  HR:           68 bpm. [facility] Procedure: 2D Echo, Cardiac Doppler, Color Doppler and Saline Contrast Bubble            Study (Both Spectral and Color Flow Doppler were utilized during            procedure). Indications:    Stroke  History:        Patient has no prior history of Echocardiogram examinations.  Sonographer:    Vern Claude Referring Phys: [id number] [facility]  Sonographer Comments: Image acquisition challenging due to respiratory motion and Limited intercoastal space. IMPRESSIONS  1. Images are limited.  2. Left ventricular ejection fraction, by estimation, is 55 to 60%. The left ventricle has normal function. Left ventricular endocardial border not optimally defined to evaluate regional wall motion. Left ventricular diastolic parameters are consistent with Grade I diastolic dysfunction (impaired relaxation).  3. Right ventricular systolic function is normal. The right ventricular size is normal. Tricuspid regurgitation signal is inadequate for assessing PA pressure.  4. The mitral valve is grossly normal. Trivial mitral valve regurgitation.  5. The aortic valve is tricuspid. Aortic valve regurgitation is not visualized.  6. Agitated saline contrast bubble study was negative, with no evidence of any interatrial shunt. Comparison(s): No prior Echocardiogram. FINDINGS  Left Ventricle: Left ventricular ejection fraction, by estimation, is 55 to 60%. The left ventricle has normal function. Left ventricular endocardial border not optimally defined to evaluate regional wall motion. Strain imaging was not performed. The left ventricular internal cavity size was normal in size. There is no left ventricular hypertrophy. Left  ventricular diastolic parameters are consistent with Grade I diastolic dysfunction (impaired relaxation). Right Ventricle: The right ventricular size is normal. Right vetricular wall thickness was not well visualized. Right ventricular systolic function is normal. Tricuspid regurgitation signal is inadequate for assessing PA pressure. Left Atrium: Left atrial size was normal in size. Right Atrium: Right atrial size was normal in size. Pericardium: There is no evidence of pericardial effusion. Mitral Valve: The mitral valve is grossly normal. Trivial mitral valve regurgitation. Tricuspid Valve: The tricuspid valve is grossly normal. Tricuspid valve regurgitation is trivial. Aortic Valve: The aortic valve is tricuspid. Aortic valve regurgitation is not visualized. Aortic valve mean gradient measures 2.0 mmHg. Aortic valve peak gradient measures 3.0 mmHg. Aortic valve area, by VTI measures 1.65 cm. Pulmonic Valve: The pulmonic valve was not well visualized. Pulmonic valve regurgitation is not visualized. Aorta: The aortic root and ascending aorta are structurally normal, with no evidence of dilitation. IAS/Shunts: No atrial level shunt detected by color flow Doppler. Agitated saline contrast was given intravenously to evaluate for intracardiac shunting. Agitated saline contrast bubble study was negative, with no evidence of any interatrial shunt. Additional Comments: 3D imaging was not performed.  LEFT VENTRICLE PLAX 2D LVIDd:         3.10 cm     Diastology LVIDs:         2.10 cm     LV e' medial:    5.00 cm/s LV PW:         0.90 cm     LV E/e' medial:  8.1 LV IVS:        0.80 cm     LV e' lateral:   5.77 cm/s LVOT diam:     1.80 cm     LV E/e' lateral: 7.0 LV SV:         29 LV SV Index:  17 LVOT Area:     2.54 cm  LV Volumes (MOD) LV vol d, MOD A2C: 97.1 ml LV vol d, MOD A4C: 93.1 ml LV vol s, MOD A2C: 32.1 ml LV vol s, MOD A4C: 27.7 ml LV SV MOD A2C:     65.0 ml LV SV MOD A4C:     93.1 ml LV SV MOD BP:      67.9  ml RIGHT VENTRICLE             IVC RV Basal diam:  3.00 cm     IVC diam: 1.00 cm RV Mid diam:    2.70 cm RV S prime:     12.40 cm/s TAPSE (M-mode): 1.9 cm LEFT ATRIUM             Index       RIGHT ATRIUM          Index LA diam:        2.10 cm 1.26 cm/m  RA Area:     6.45 cm LA Vol (A2C):   11.3 ml 6.77 ml/m  RA Volume:   9.35 ml  5.61 ml/m LA Vol (A4C):   12.4 ml 7.43 ml/m LA Biplane Vol: 13.1 ml 7.85 ml/m  AORTIC VALVE AV Area (Vmax):    1.68 cm AV Area (Vmean):   1.49 cm AV Area (VTI):     1.65 cm AV Vmax:           86.20 cm/s AV Vmean:          62.100 cm/s AV VTI:            0.176 m AV Peak Grad:      3.0 mmHg AV Mean Grad:      2.0 mmHg LVOT Vmax:         56.80 cm/s LVOT Vmean:        36.300 cm/s LVOT VTI:          0.114 m LVOT/AV VTI ratio: 0.65  AORTA Ao Root diam: 2.60 cm Ao Asc diam:  2.30 cm MITRAL VALVE MV Area (PHT): 4.17 cm    SHUNTS MV Decel Time: 182 msec    Systemic VTI:  0.11 m MV E velocity: 40.30 cm/s  Systemic Diam: 1.80 cm MV A velocity: 39.00 cm/s MV E/A ratio:  1.03 Nona Dell MD Electronically signed by Nona Dell MD Signature Date/Time: 06/25/2023/2:59:47 PM    Final    MR BRAIN WO CONTRAST Result Date: 06/24/2023 CLINICAL DATA:  History of right-sided numbness of the arms, legs, and face. Feeling off balance. Concern for stroke. EXAM: MRI HEAD WITHOUT CONTRAST TECHNIQUE: Multiplanar, multiecho pulse sequences of the brain and surrounding structures were obtained without intravenous contrast. COMPARISON:  CTA head and neck earlier same day. FINDINGS: Brain: 1.3 x 0.6 cm focus of restricted diffusion in the left thalamus compatible with acute infarct. No evidence of mass effect. No additional areas of restricted diffusion noted. No evidence of intracranial hemorrhage. Few scattered areas of white matter signal abnormality likely within normal limits for patient's age. No mass lesion or midline shift. Cerebellum is unremarkable. Normal appearance of midline structures. The  basilar cisterns are patent. No extra-axial fluid collections. Ventricles: Normal size and configuration of the ventricles. Vascular: Skull base flow voids are visualized. Skull and upper cervical spine: No focal abnormality. Sinuses/Orbits: Orbits are symmetric. Mild mucosal thickening in the ethmoid sinuses. Other: Trace fluid in the right mastoid air cells. IMPRESSION: Acute infarct in the left thalamus. No  mass effect. No midline shift. Electronically Signed   By: Emily Filbert M.D.   On: 06/24/2023 18:37   CT ANGIO HEAD NECK W WO CM Result Date: 06/24/2023 CLINICAL DATA:  Numbness to right side of body, disequilibrium, slight headache, concern for stroke. EXAM: CT ANGIOGRAPHY HEAD AND NECK WITH AND WITHOUT CONTRAST TECHNIQUE: Multidetector CT imaging of the head and neck was performed using the standard protocol during bolus administration of intravenous contrast. Multiplanar CT image reconstructions and MIPs were obtained to evaluate the vascular anatomy. Carotid stenosis measurements (when applicable) are obtained utilizing NASCET criteria, using the distal internal carotid diameter as the denominator. RADIATION DOSE REDUCTION: This exam was performed according to the departmental dose-optimization program which includes automated exposure control, adjustment of the mA and/or kV according to patient size and/or use of iterative reconstruction technique. CONTRAST:  75mL OMNIPAQUE IOHEXOL 350 MG/ML SOLN COMPARISON:  MRI head 08/10/2003 FINDINGS: CT HEAD FINDINGS Brain: No acute intracranial hemorrhage. No CT evidence of acute infarct. No edema, mass effect, or midline shift. The basilar cisterns are patent. Ventricles: Ventricles are normal in size and configuration. Vascular: No hyperdense vessel. Skull: No acute or aggressive finding. Sinuses/orbits: The visualized paranasal sinuses are clear. Orbits are symmetric. Other: Mastoid air cells are clear. CTA NECK FINDINGS Aortic arch: Standard configuration of  the aortic arch. Imaged portion shows no evidence of aneurysm or dissection. Mild atherosclerosis of the aortic arch. No significant stenosis of the major arch vessel origins. Pulmonary arteries: As permitted by contrast timing, there are no filling defects in the visualized pulmonary arteries. Subclavian arteries: The subclavian arteries are patent bilaterally. Mild atherosclerosis involving the proximal subclavian arteries bilaterally without stenosis. Right carotid system: No evidence of dissection, stenosis (50% or greater), or occlusion. Tortuosity of the mid cervical ICA. Left carotid system: Patent. No significant stenosis (50% or greater). No significant calcified atherosclerosis. There is focal irregularity of the distal cervical ICA approximately 1.5 cm proximal to the skull base with an associated 3.5 x 2 mm medially directed outpouching concerning for remote dissection and associated pseudoaneurysm. There is no significant luminal compromise. Vertebral arteries: Codominant. No evidence of dissection, stenosis (50% or greater), or occlusion. Skeleton: No acute or aggressive finding noted. Other neck: The visualized airway is patent. No cervical lymphadenopathy. Upper chest: Biapical pleuroparenchymal scarring.  Emphysema noted. Review of the MIP images confirms the above findings CTA HEAD FINDINGS ANTERIOR CIRCULATION: The intracranial ICAs are patent bilaterally. No significant stenosis, proximal occlusion, aneurysm, or vascular malformation. MCAs: The middle cerebral arteries are patent bilaterally. ACAs: The anterior cerebral arteries are patent bilaterally. POSTERIOR CIRCULATION: No significant stenosis, proximal occlusion, aneurysm, or vascular malformation. PCAs: The posterior cerebral arteries are patent bilaterally. Pcomm: Not well visualized. SCAs: The superior cerebellar arteries are patent bilaterally. Basilar artery: Patent AICAs: Not well visualized. PICAs: Patent Vertebral arteries: The  intracranial vertebral arteries are patent. Venous sinuses: As permitted by contrast timing, patent. There is likely a prominent arachnoid granulation in the vein of Galen at the junction with the straight sinus. Anatomic variants: None Review of the MIP images confirms the above findings IMPRESSION: 1. No acute intracranial abnormality. 2. No intracranial large vessel occlusion or high-grade stenosis. 3. 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 No luminal compromise. 4. No significant stenosis of the cervical carotid or vertebral arteries. 5. Aortic Atherosclerosis (ICD10-I70.0) and Emphysema (ICD10-J43.9). Electronically Signed   By: Phylliss Blakes.D.  On: 06/24/2023 12:42     Subjective: Pt reports still having some numbness in the hand but mostly back to baseline.   Discharge Exam: Vitals:   06/25/23 0916 06/25/23 1209  BP: (!) 147/88 (!) 146/88  Pulse:  (!) 56  Resp:  17  Temp:  98.6 F (37 C)  SpO2:  96%   Vitals:   06/24/23 2212 06/25/23 0451 06/25/23 0916 06/25/23 1209  BP: (!) 139/91 (!) 145/80 (!) 147/88 (!) 146/88  Pulse: 80 (!) 59  (!) 56  Resp: 16 20  17   Temp: (!) 97.5 F (36.4 C) (!) 97.3 F (36.3 C)  98.6 F (37 C)  TempSrc: Oral Oral    SpO2: 97% 97%  96%  Weight:      Height:       General: Pt is alert, awake, not in acute distress Cardiovascular: normal S1/S2 +, no rubs, no gallops Respiratory: CTA bilaterally, no wheezing, no rhonchi Abdominal: Soft, NT, ND, bowel sounds + Extremities: no edema, no cyanosis Neurological: nonfocal exam.    The results of significant diagnostics from this hospitalization (including imaging, microbiology, ancillary and laboratory) are listed below for reference.     Microbiology: No results found for this or any previous visit (from the past 240 hours).   Labs: BNP (last 3 results) No results for  input(s): "BNP" in the last 8760 hours. Basic Metabolic Panel: Recent Labs  Lab 06/24/23 1051 06/24/23 1057 06/25/23 0401  NA 136 140 137  K 3.6 3.8 3.7  CL 104 105 105  CO2 23  --  22  GLUCOSE 122* 123* 78  BUN 11 10 13   CREATININE 0.80 0.90 0.72  CALCIUM 9.2  --  9.0  MG 2.2  --  2.3  PHOS  --   --  2.4*   Liver Function Tests: Recent Labs  Lab 06/24/23 1051 06/25/23 0401  AST 32 23  ALT 25 24  ALKPHOS 89 77  BILITOT 1.2 1.5*  PROT 7.9 7.1  ALBUMIN 4.2 3.6   No results for input(s): "LIPASE", "AMYLASE" in the last 168 hours. No results for input(s): "AMMONIA" in the last 168 hours. CBC: Recent Labs  Lab 06/24/23 1051 06/24/23 1057 06/25/23 0401  WBC 5.8  --  7.0  NEUTROABS 4.4  --  5.0  HGB 17.1* 18.4* 15.6  HCT 49.3 54.0* 46.1  MCV 100.0  --  101.5*  PLT 193  --  200   Cardiac Enzymes: No results for input(s): "CKTOTAL", "CKMB", "CKMBINDEX", "TROPONINI" in the last 168 hours. BNP: Invalid input(s): "POCBNP" CBG: No results for input(s): "GLUCAP" in the last 168 hours. D-Dimer No results for input(s): "DDIMER" in the last 72 hours. Hgb A1c Recent Labs    06/24/23 1414  HGBA1C 5.3   Lipid Profile Recent Labs    06/24/23 1414  LDLDIRECT 58   Thyroid function studies Recent Labs    06/24/23 1414  TSH 1.248   Anemia work up No results for input(s): "VITAMINB12", "FOLATE", "FERRITIN", "TIBC", "IRON", "RETICCTPCT" in the last 72 hours. Urinalysis    Component Value Date/Time   COLORURINE STRAW (A) 06/24/2023 1134   APPEARANCEUR CLEAR 06/24/2023 1134   LABSPEC 1.024 06/24/2023 1134   PHURINE 7.0 06/24/2023 1134   GLUCOSEU NEGATIVE 06/24/2023 1134   HGBUR NEGATIVE 06/24/2023 1134   BILIRUBINUR NEGATIVE 06/24/2023 1134   KETONESUR NEGATIVE 06/24/2023 1134   PROTEINUR NEGATIVE 06/24/2023 1134   NITRITE NEGATIVE 06/24/2023 1134   LEUKOCYTESUR NEGATIVE 06/24/2023 1134   Sepsis  Labs Recent Labs  Lab 06/24/23 1051 06/25/23 0401  WBC 5.8  7.0   Microbiology No results found for this or any previous visit (from the past 240 hours).  Time coordinating discharge:  37 mins   SIGNED:  Standley Dakins, MD  [facility] 06/25/2023, 4:03 PM How to contact the [facility] Attending or Consulting provider 7A - 7P or covering provider during after hours 7P -7A, for this patient?  Check the care team in [facility] and look for a) attending/consulting [facility] provider listed and b) the [facility] team listed Log into [email or web address] and use [facility]'s universal password to access. If you do not have the password, please contact the hospital operator. Locate the [facility] provider you are looking for under [facility] and page to a number that you can be directly reached. If you still have difficulty reaching the provider, please page the [facility] (Director on Call) for the Hospitalists listed on amion for assistance.

## 2023-06-25 NOTE — Progress Notes (Signed)
  Echocardiogram 2D Echocardiogram has been performed.  Ocie Doyne RDCS 06/25/2023, 2:37 PM

## 2023-06-25 NOTE — Evaluation (Signed)
 Occupational Therapy Evaluation Patient Details Name: Richard Manning MRN: [id number] DOB: 07-12-1955 Today's Date: 06/25/2023   History of Present Illness   a 68 y.o. male with medical history significant of rheumatoid arthritis on methotrexate/prednisone prn, HLD who presents to the ED with right-sided numbness of arms/legs/face and feeling off balance since 9 AM yesterday.  He reports he fell this morning due to balance issues but did not hit his head or lose consciousness.  Denies any chest pain, palpitations, shortness of breath.  No recent illness, fever, nausea or vomiting     Clinical Impressions Pt agreeable to OT and PT co-evaluation. Pt independent at baseline. Pt reports continued R UE and LE numbness but is able to localize touch well and reports no dulled sensation during palpation. Pt is independent for mobility and ADL's. Pt left in the bed with call bell within reach. Pt is not recommended for further acute OT services and will be discharged to care of nursing staff for remaining length of stay.                Functional Status Assessment   Patient has not had a recent decline in their functional status     Equipment Recommendations   None recommended by OT             Precautions/Restrictions   Precautions Precautions: None Restrictions Weight Bearing Restrictions Per Provider Order: No     Mobility Bed Mobility Overal bed mobility: Independent                  Transfers Overall transfer level: Independent Equipment used: None                      Balance Overall balance assessment: Independent                                         ADL either performed or assessed with clinical judgement   ADL Overall ADL's : Independent                                             Vision Baseline Vision/History: 1 Wears glasses Ability to See in Adequate Light: 1 Impaired Patient Visual  Report: No change from baseline Vision Assessment?: No apparent visual deficits Additional Comments: baseline issues     Perception Perception: Not tested       Praxis Praxis: Not tested       Pertinent Vitals/Pain Pain Assessment Pain Assessment: No/denies pain     Extremity/Trunk Assessment Upper Extremity Assessment Upper Extremity Assessment: Overall WFL for tasks assessed   Lower Extremity Assessment Lower Extremity Assessment: Defer to PT evaluation RLE Deficits / Details: 4/5 knee extension RLE Sensation: WNL LLE Deficits / Details: 5/5 knee extension LLE Sensation: WNL   Cervical / Trunk Assessment Cervical / Trunk Assessment: Normal   Communication Communication Communication: No apparent difficulties   Cognition Arousal: Alert Behavior During Therapy: WFL for tasks assessed/performed                                 Following commands: Intact       Cueing  General Comments   Cueing Techniques: Verbal cues  Home Living Family/patient expects to be discharged to:: Private residence Living Arrangements: Alone Available Help at Discharge: Friend(s);Available PRN/intermittently Type of Home: House Home Access: Level entry     Home Layout: One level     Bathroom Shower/Tub: Chief Strategy Officer: Standard     Home Equipment: None          Prior Functioning/Environment Prior Level of Function : Independent/Modified Independent             Mobility Comments: independent with all mobility ADLs Comments: independent with all ADLs                            Co-evaluation PT/OT/SLP Co-Evaluation/Treatment: Yes Reason for Co-Treatment: To address functional/ADL transfers PT goals addressed during session: Mobility/safety with mobility OT goals addressed during session: ADL's and self-care      AM-PAC OT "6 Clicks" Daily Activity     Outcome Measure Help from another person  eating meals?: None Help from another person taking care of personal grooming?: None Help from another person toileting, which includes using toliet, bedpan, or urinal?: None Help from another person bathing (including washing, rinsing, drying)?: None Help from another person to put on and taking off regular upper body clothing?: None Help from another person to put on and taking off regular lower body clothing?: None 6 Click Score: 24   End of Session Equipment Utilized During Treatment: Gait belt  Activity Tolerance: Patient tolerated treatment well Patient left: in bed;with call bell/phone within reach  OT Visit Diagnosis: Other symptoms and signs involving the nervous system (R29.898);Unsteadiness on feet (R26.81)                Time: 1027-2536 OT Time Calculation (min): 14 min Charges:  OT General Charges $OT Visit: 1 Visit OT Evaluation $OT Eval Low Complexity: 1 Low  [name] OT, MOT   Danie Chandler 06/25/2023, 9:36 AM

## 2023-06-25 NOTE — TOC CM/SW Note (Signed)
 Transition of Care [facility]) - Inpatient Brief Assessment   Patient Details  Name: GIANN OBARA MRN: [id number] Date of Birth: [birth year]  [facility] CM/SW Contact:    Isabella Bowens, LCSWA Phone Number: 06/25/2023, 10:08 AM   Clinical Narrative:  Transition of Care Department [facility]) has reviewed patient and no TOC needs have been identified at this time. We will continue to monitor patient advancement through interdisciplinary progression rounds. If new patient transition needs arise, please place a TOC consult.   PT made no recommendations.   Transition of Care Asessment: Insurance and Status: Insurance coverage has been reviewed Patient has primary care physician: Yes Home environment has been reviewed: Single Family Home Prior level of function:: Independent Prior/Current Home Services: No current home services Social Drivers of Health Review: SDOH reviewed no interventions necessary Readmission risk has been reviewed: Yes Transition of care needs: no transition of care needs at this time

## 2023-06-27 LAB — HIV-1/2 AB - DIFFERENTIATION
HIV 1 Ab: NONREACTIVE
HIV 2 Ab: NONREACTIVE
Note: NEGATIVE

## 2023-06-27 LAB — HIV-1/HIV-2 QUALITATIVE RNA
Final Interpretation: NEGATIVE
HIV-1 RNA, Qualitative: NONREACTIVE
HIV-2 RNA, Qualitative: NONREACTIVE

## 2023-07-01 DIAGNOSIS — I639 Cerebral infarction, unspecified: Secondary | ICD-10-CM | POA: Diagnosis not present

## 2023-07-01 DIAGNOSIS — Z7689 Persons encountering health services in other specified circumstances: Secondary | ICD-10-CM | POA: Diagnosis not present

## 2023-07-01 DIAGNOSIS — F17210 Nicotine dependence, cigarettes, uncomplicated: Secondary | ICD-10-CM | POA: Diagnosis not present

## 2023-07-01 DIAGNOSIS — I10 Essential (primary) hypertension: Secondary | ICD-10-CM | POA: Diagnosis not present

## 2023-07-04 DIAGNOSIS — G464 Cerebellar stroke syndrome: Secondary | ICD-10-CM

## 2023-07-05 DIAGNOSIS — G464 Cerebellar stroke syndrome: Secondary | ICD-10-CM | POA: Diagnosis not present

## 2023-07-08 ENCOUNTER — Ambulatory Visit: Attending: Cardiology

## 2023-09-10 ENCOUNTER — Ambulatory Visit: Payer: Medicare Other | Admitting: Neurology

## 2023-10-21 DIAGNOSIS — Z125 Encounter for screening for malignant neoplasm of prostate: Secondary | ICD-10-CM | POA: Diagnosis not present

## 2023-10-21 DIAGNOSIS — M069 Rheumatoid arthritis, unspecified: Secondary | ICD-10-CM | POA: Diagnosis not present

## 2023-10-21 DIAGNOSIS — E559 Vitamin D deficiency, unspecified: Secondary | ICD-10-CM | POA: Diagnosis not present

## 2023-10-27 DIAGNOSIS — D7589 Other specified diseases of blood and blood-forming organs: Secondary | ICD-10-CM | POA: Diagnosis not present

## 2023-10-27 DIAGNOSIS — Z713 Dietary counseling and surveillance: Secondary | ICD-10-CM | POA: Diagnosis not present

## 2023-10-27 DIAGNOSIS — F17200 Nicotine dependence, unspecified, uncomplicated: Secondary | ICD-10-CM | POA: Diagnosis not present

## 2023-10-27 DIAGNOSIS — K4090 Unilateral inguinal hernia, without obstruction or gangrene, not specified as recurrent: Secondary | ICD-10-CM | POA: Diagnosis not present

## 2023-10-27 DIAGNOSIS — Z79899 Other long term (current) drug therapy: Secondary | ICD-10-CM | POA: Diagnosis not present

## 2023-10-27 DIAGNOSIS — M069 Rheumatoid arthritis, unspecified: Secondary | ICD-10-CM | POA: Diagnosis not present

## 2023-10-27 DIAGNOSIS — Z681 Body mass index (BMI) 19 or less, adult: Secondary | ICD-10-CM | POA: Diagnosis not present

## 2023-10-27 DIAGNOSIS — I700 Atherosclerosis of aorta: Secondary | ICD-10-CM | POA: Diagnosis not present

## 2023-10-27 DIAGNOSIS — E559 Vitamin D deficiency, unspecified: Secondary | ICD-10-CM | POA: Diagnosis not present

## 2023-10-27 DIAGNOSIS — I10 Essential (primary) hypertension: Secondary | ICD-10-CM | POA: Diagnosis not present

## 2023-10-27 DIAGNOSIS — Z8673 Personal history of transient ischemic attack (TIA), and cerebral infarction without residual deficits: Secondary | ICD-10-CM | POA: Diagnosis not present

## 2023-10-27 DIAGNOSIS — I2510 Atherosclerotic heart disease of native coronary artery without angina pectoris: Secondary | ICD-10-CM | POA: Diagnosis not present

## 2024-04-20 ENCOUNTER — Other Ambulatory Visit (HOSPITAL_COMMUNITY): Payer: Self-pay | Admitting: Internal Medicine

## 2024-04-20 DIAGNOSIS — F17200 Nicotine dependence, unspecified, uncomplicated: Secondary | ICD-10-CM

## 2024-04-20 DIAGNOSIS — Z87891 Personal history of nicotine dependence: Secondary | ICD-10-CM

## 2024-05-10 ENCOUNTER — Ambulatory Visit (HOSPITAL_COMMUNITY)
Admission: RE | Admit: 2024-05-10 | Discharge: 2024-05-10 | Disposition: A | Discharge status: Home / Self Care | Source: Ambulatory Visit | Attending: Internal Medicine | Admitting: Internal Medicine

## 2024-05-10 DIAGNOSIS — F17200 Nicotine dependence, unspecified, uncomplicated: Secondary | ICD-10-CM | POA: Insufficient documentation

## 2024-05-10 DIAGNOSIS — Z87891 Personal history of nicotine dependence: Secondary | ICD-10-CM | POA: Diagnosis present

## 2024-05-13 ENCOUNTER — Other Ambulatory Visit: Payer: Self-pay

## 2024-05-13 ENCOUNTER — Emergency Department (HOSPITAL_COMMUNITY)
Admission: EM | Admit: 2024-05-13 | Discharge: 2024-05-13 | Discharge status: Against Medical Advice | Attending: Emergency Medicine | Admitting: Emergency Medicine

## 2024-05-13 ENCOUNTER — Encounter (HOSPITAL_COMMUNITY): Payer: Self-pay

## 2024-05-13 ENCOUNTER — Emergency Department (HOSPITAL_COMMUNITY)

## 2024-05-13 DIAGNOSIS — R0981 Nasal congestion: Secondary | ICD-10-CM | POA: Diagnosis not present

## 2024-05-13 DIAGNOSIS — R6883 Chills (without fever): Secondary | ICD-10-CM | POA: Diagnosis not present

## 2024-05-13 DIAGNOSIS — Z5321 Procedure and treatment not carried out due to patient leaving prior to being seen by health care provider: Secondary | ICD-10-CM | POA: Diagnosis not present

## 2024-05-13 DIAGNOSIS — R5383 Other fatigue: Secondary | ICD-10-CM | POA: Insufficient documentation

## 2024-05-13 DIAGNOSIS — R059 Cough, unspecified: Secondary | ICD-10-CM | POA: Diagnosis present

## 2024-05-13 NOTE — ED Triage Notes (Signed)
 Pt reports cough, fatigue, chills, nasal congestion since yesterday.

## 2024-05-13 NOTE — ED Notes (Addendum)
 Pt said he did not want to wait any longer and is going to call for his ride

## 2024-06-29 ENCOUNTER — Encounter: Admitting: Thoracic Surgery (Cardiothoracic Vascular Surgery)
